# Patient Record
Sex: Male | Born: 1960 | Race: Black or African American | Hispanic: No | Marital: Married | Smoking: Never smoker
Health system: Southern US, Community
[De-identification: ages and names within clinical notes are randomized; demographics above are authoritative.]

## PROBLEM LIST (undated history)

## (undated) DIAGNOSIS — M1990 Unspecified osteoarthritis, unspecified site: Secondary | ICD-10-CM

## (undated) DIAGNOSIS — I10 Essential (primary) hypertension: Secondary | ICD-10-CM

## (undated) DIAGNOSIS — E785 Hyperlipidemia, unspecified: Secondary | ICD-10-CM

## (undated) DIAGNOSIS — R7303 Prediabetes: Secondary | ICD-10-CM

## (undated) DIAGNOSIS — I639 Cerebral infarction, unspecified: Secondary | ICD-10-CM

## (undated) HISTORY — PX: COLONOSCOPY: SHX174

---

## 2007-05-13 ENCOUNTER — Ambulatory Visit: Payer: Self-pay | Admitting: Unknown Physician Specialty

## 2007-05-25 ENCOUNTER — Ambulatory Visit: Payer: Self-pay

## 2012-09-23 ENCOUNTER — Ambulatory Visit: Payer: Self-pay | Admitting: Gastroenterology

## 2012-10-03 LAB — PATHOLOGY REPORT

## 2014-03-26 DIAGNOSIS — E785 Hyperlipidemia, unspecified: Secondary | ICD-10-CM

## 2014-03-26 DIAGNOSIS — I10 Essential (primary) hypertension: Secondary | ICD-10-CM | POA: Diagnosis present

## 2016-06-05 ENCOUNTER — Ambulatory Visit
Admission: RE | Admit: 2016-06-05 | Discharge: 2016-06-05 | Disposition: A | Discharge status: Home / Self Care | Payer: Self-pay | Source: Ambulatory Visit | Attending: Student | Admitting: Student

## 2016-06-05 ENCOUNTER — Other Ambulatory Visit: Payer: Self-pay | Admitting: Student

## 2016-06-05 DIAGNOSIS — N50812 Left testicular pain: Secondary | ICD-10-CM

## 2016-06-05 DIAGNOSIS — N5089 Other specified disorders of the male genital organs: Secondary | ICD-10-CM

## 2016-06-05 DIAGNOSIS — N433 Hydrocele, unspecified: Secondary | ICD-10-CM | POA: Insufficient documentation

## 2021-01-09 ENCOUNTER — Other Ambulatory Visit: Payer: Self-pay

## 2021-01-09 ENCOUNTER — Emergency Department: Payer: 59

## 2021-01-09 ENCOUNTER — Inpatient Hospital Stay
Admission: EM | Admit: 2021-01-09 | Discharge: 2021-01-10 | DRG: 066 | Disposition: A | Discharge status: Home / Self Care | Payer: 59 | Attending: Internal Medicine | Admitting: Internal Medicine

## 2021-01-09 ENCOUNTER — Inpatient Hospital Stay: Payer: 59

## 2021-01-09 ENCOUNTER — Encounter: Payer: Self-pay | Admitting: Internal Medicine

## 2021-01-09 DIAGNOSIS — Z20822 Contact with and (suspected) exposure to covid-19: Secondary | ICD-10-CM | POA: Diagnosis present

## 2021-01-09 DIAGNOSIS — I63541 Cerebral infarction due to unspecified occlusion or stenosis of right cerebellar artery: Principal | ICD-10-CM | POA: Diagnosis present

## 2021-01-09 DIAGNOSIS — R7301 Impaired fasting glucose: Secondary | ICD-10-CM | POA: Diagnosis present

## 2021-01-09 DIAGNOSIS — I639 Cerebral infarction, unspecified: Secondary | ICD-10-CM

## 2021-01-09 DIAGNOSIS — E785 Hyperlipidemia, unspecified: Secondary | ICD-10-CM | POA: Diagnosis present

## 2021-01-09 DIAGNOSIS — I10 Essential (primary) hypertension: Secondary | ICD-10-CM | POA: Diagnosis present

## 2021-01-09 DIAGNOSIS — R29700 NIHSS score 0: Secondary | ICD-10-CM | POA: Diagnosis present

## 2021-01-09 DIAGNOSIS — R42 Dizziness and giddiness: Secondary | ICD-10-CM

## 2021-01-09 HISTORY — DX: Essential (primary) hypertension: I10

## 2021-01-09 HISTORY — DX: Hyperlipidemia, unspecified: E78.5

## 2021-01-09 LAB — CBC
HCT: 46.2 % (ref 39.0–52.0)
Hemoglobin: 15.4 g/dL (ref 13.0–17.0)
MCH: 29.2 pg (ref 26.0–34.0)
MCHC: 33.3 g/dL (ref 30.0–36.0)
MCV: 87.7 fL (ref 80.0–100.0)
Platelets: 202 10*3/uL (ref 150–400)
RBC: 5.27 MIL/uL (ref 4.22–5.81)
RDW: 12.8 % (ref 11.5–15.5)
WBC: 6.5 10*3/uL (ref 4.0–10.5)
nRBC: 0 % (ref 0.0–0.2)

## 2021-01-09 LAB — URINALYSIS, COMPLETE (UACMP) WITH MICROSCOPIC
Bilirubin Urine: NEGATIVE
Glucose, UA: NEGATIVE mg/dL
Hgb urine dipstick: NEGATIVE
Ketones, ur: NEGATIVE mg/dL
Nitrite: NEGATIVE
Protein, ur: NEGATIVE mg/dL
Specific Gravity, Urine: 1.012 (ref 1.005–1.030)
Squamous Epithelial / HPF: NONE SEEN (ref 0–5)
pH: 6 (ref 5.0–8.0)

## 2021-01-09 LAB — BASIC METABOLIC PANEL
Anion gap: 10 (ref 5–15)
BUN: 15 mg/dL (ref 6–20)
CO2: 26 mmol/L (ref 22–32)
Calcium: 9.5 mg/dL (ref 8.9–10.3)
Chloride: 101 mmol/L (ref 98–111)
Creatinine, Ser: 1.05 mg/dL (ref 0.61–1.24)
GFR, Estimated: >60 mL/min (ref >60)
Glucose, Bld: 106 mg/dL — ABNORMAL HIGH (ref 70–99)
Potassium: 4.1 mmol/L (ref 3.5–5.1)
Sodium: 137 mmol/L (ref 135–145)

## 2021-01-09 LAB — HEMOGLOBIN A1C
Hgb A1c MFr Bld: 5.9 % — ABNORMAL HIGH (ref 4.8–5.6)
Mean Plasma Glucose: 122.63 mg/dL

## 2021-01-09 LAB — RESP PANEL BY RT-PCR (FLU A&B, COVID) ARPGX2
Influenza A by PCR: NEGATIVE
Influenza B by PCR: NEGATIVE
SARS Coronavirus 2 by RT PCR: NEGATIVE

## 2021-01-09 LAB — TSH: TSH: 0.256 u[IU]/mL — ABNORMAL LOW (ref 0.350–4.500)

## 2021-01-09 LAB — LDL CHOLESTEROL, DIRECT: Direct LDL: 152.6 mg/dL — ABNORMAL HIGH (ref 0–99)

## 2021-01-09 MED ORDER — ACETAMINOPHEN 650 MG RE SUPP: 650.0000 mg | RECTAL | Status: DC | PRN

## 2021-01-09 MED ORDER — CLOPIDOGREL BISULFATE 75 MG PO TABS
75.0000 mg | ORAL_TABLET | Freq: Every day | ORAL | Status: DC
  Administered 2021-01-09 – 2021-01-10 (×2): 75 mg via ORAL
  Filled 2021-01-09 (×2): qty 1

## 2021-01-09 MED ORDER — GADOBUTROL 1 MMOL/ML IV SOLN
9.0000 mL | Freq: Once | INTRAVENOUS | Status: AC | PRN
  Administered 2021-01-09: 9 mL via INTRAVENOUS
  Filled 2021-01-09: qty 10

## 2021-01-09 MED ORDER — ACETAMINOPHEN 325 MG PO TABS: 650.0000 mg | ORAL_TABLET | ORAL | Status: DC | PRN

## 2021-01-09 MED ORDER — ONDANSETRON 8 MG PO TBDP
8.0000 mg | ORAL_TABLET | ORAL | Status: AC
  Administered 2021-01-09: 8 mg via ORAL
  Filled 2021-01-09: qty 1

## 2021-01-09 MED ORDER — ASPIRIN 81 MG PO CHEW
324.0000 mg | CHEWABLE_TABLET | Freq: Once | ORAL | Status: AC
  Administered 2021-01-09: 324 mg via ORAL
  Filled 2021-01-09: qty 4

## 2021-01-09 MED ORDER — ACETAMINOPHEN 160 MG/5ML PO SOLN
650.0000 mg | ORAL | Status: DC | PRN
  Filled 2021-01-09: qty 20.3

## 2021-01-09 MED ORDER — ENOXAPARIN SODIUM 40 MG/0.4ML IJ SOSY
40.0000 mg | PREFILLED_SYRINGE | INTRAMUSCULAR | Status: DC
  Administered 2021-01-10: 40 mg via SUBCUTANEOUS
  Filled 2021-01-09: qty 0.4

## 2021-01-09 MED ORDER — MECLIZINE HCL 25 MG PO TABS
25.0000 mg | ORAL_TABLET | Freq: Three times a day (TID) | ORAL | Status: DC | PRN
  Filled 2021-01-09: qty 1

## 2021-01-09 MED ORDER — ASPIRIN EC 81 MG PO TBEC
81.0000 mg | DELAYED_RELEASE_TABLET | Freq: Every day | ORAL | Status: DC
  Administered 2021-01-10: 81 mg via ORAL
  Filled 2021-01-09: qty 1

## 2021-01-09 MED ORDER — MECLIZINE HCL 25 MG PO TABS
50.0000 mg | ORAL_TABLET | Freq: Once | ORAL | Status: AC
  Administered 2021-01-09: 50 mg via ORAL
  Filled 2021-01-09: qty 2

## 2021-01-09 MED ORDER — SENNOSIDES-DOCUSATE SODIUM 8.6-50 MG PO TABS: 1.0000 | ORAL_TABLET | Freq: Every evening | ORAL | Status: DC | PRN

## 2021-01-09 MED ORDER — STROKE: EARLY STAGES OF RECOVERY BOOK: Freq: Once | Status: DC

## 2021-01-09 MED ORDER — ATORVASTATIN CALCIUM 20 MG PO TABS
40.0000 mg | ORAL_TABLET | Freq: Every day | ORAL | Status: DC
  Administered 2021-01-09 – 2021-01-10 (×2): 40 mg via ORAL
  Filled 2021-01-09 (×2): qty 2

## 2021-01-09 NOTE — H&P (Signed)
[facility] History and Physical  Nathan Evans NOM:[id number] DOB: 05-08-1961 DOA: 01/09/2021  Referring physician: ED  PCP: Pcp, No   Patient is coming from: Home  Chief Complaint: Dizziness  HPI: Nathan Evans is a 60 y.o. male with past medical history of hypertension and hyperlipidemia presented to hospital with dizziness since 9 AM yesterday.  He stated that he had multiple episodes of dizziness sweating diaphoresis and nausea up to 5 times yesterday.  He was very unsteady on his feet and had to hold on the wall.  This morning also he did have 2 episodes of similar episodes of dizziness and nausea.  Patient states that he was supposed to be on medication for blood pressure but has not been taking it.  Used to be on hydrochlorothiazide in the past.  ED Course: In the ED, patient to have high blood pressure of 193/104.  No focal neurological deficits.  EKG was within normal limits.  MRI of the brain was performed which showed evidence of small acute infarcts in the inferior medial right cerebellum measuring up to 7 mm.  Cerebral white matter chronic ischemic disease.  Angiogram of the head showed no intracranial large vessel occlusion.  Labs were within normal limits.  Patient was then considered for admission to hospital for CVA.  Review of Systems:  All systems were reviewed and were negative unless otherwise mentioned in the HPI  Past Medical History:  Diagnosis Date  . Hyperlipidemia   . Hypertension    History reviewed. No pertinent surgical history.  Social History:  reports that he has never smoked. He has never used smokeless tobacco. No history on file for alcohol use and drug use.  No Known Allergies  History reviewed. No pertinent family history.   Prior to Admission medications   Medication Sig Start Date End Date Taking? Authorizing Provider  aspirin 81 MG EC tablet Take 81 mg by mouth daily.   Yes [provider]  fexofenadine  (ALLEGRA) 180 MG tablet Take 180 mg by mouth daily.   Yes [provider]  hydrochlorothiazide (HYDRODIURIL) 25 MG tablet Take 1 tablet by mouth daily. Patient not taking: Reported on 01/09/2021 11/06/16   [provider]  simvastatin (ZOCOR) 20 MG tablet Take 20 mg by mouth daily. Patient not taking: Reported on 01/09/2021 11/06/16   [provider]    Physical Exam: Vitals:   01/09/21 1027 01/09/21 1446  BP: (S) (!) 193/104 (!) 191/112  Pulse: 79 72  Resp: 17 18  Temp: 98.2 F (36.8 C)   TempSrc: Oral   SpO2: 100% 98%   Wt Readings from Last 3 Encounters:  No data found for Wt   There is no height or weight on file to calculate BMI.  General:  Average built, not in obvious distress HENT: Normocephalic, pupils equally reacting to light and accommodation.  No scleral pallor or icterus noted. Oral mucosa is moist.  Chest:  Clear breath sounds.  Diminished breath sounds bilaterally. No crackles or wheezes.  CVS: S1 &S2 heard. No murmur.  Regular rate and rhythm. Abdomen: Soft, nontender, nondistended.  Bowel sounds are heard.  Liver is not palpable, no abdominal mass palpated Extremities: No cyanosis, clubbing or edema.  Peripheral pulses are palpable. Psych: Alert, awake and oriented, normal mood CNS:  No cranial nerve deficits.  Power equal in all extremities.   No cerebellar signs.   Skin: Warm and dry.  No rashes noted.  Labs on Admission:   CBC: Recent  Labs  Lab 01/09/21 1027  WBC 6.5  HGB 15.4  HCT 46.2  MCV 87.7  PLT 202    Basic Metabolic Panel: Recent Labs  Lab 01/09/21 1027  NA 137  K 4.1  CL 101  CO2 26  GLUCOSE 106*  BUN 15  CREATININE 1.05  CALCIUM 9.5    Liver Function Tests: No results for input(s): AST, ALT, ALKPHOS, BILITOT, PROT, ALBUMIN in the last 168 hours. No results for input(s): LIPASE, AMYLASE in the last 168 hours. No results for input(s): AMMONIA in the last 168 hours.  Cardiac Enzymes: No results for  input(s): CKTOTAL, CKMB, CKMBINDEX, TROPONINI in the last 168 hours.  BNP (last 3 results) No results for input(s): BNP in the last 8760 hours.  ProBNP (last 3 results) No results for input(s): PROBNP in the last 8760 hours.  CBG: No results for input(s): GLUCAP in the last 168 hours.  Lipase  No results found for: LIPASE   Urinalysis    Component Value Date/Time   COLORURINE YELLOW (A) 01/09/2021 1300   APPEARANCEUR HAZY (A) 01/09/2021 1300   LABSPEC 1.012 01/09/2021 1300   PHURINE 6.0 01/09/2021 1300   GLUCOSEU NEGATIVE 01/09/2021 1300   HGBUR NEGATIVE 01/09/2021 1300   BILIRUBINUR NEGATIVE 01/09/2021 1300   KETONESUR NEGATIVE 01/09/2021 1300   PROTEINUR NEGATIVE 01/09/2021 1300   NITRITE NEGATIVE 01/09/2021 1300   LEUKOCYTESUR TRACE (A) 01/09/2021 1300     Drugs of Abuse  No results found for: LABOPIA, COCAINSCRNUR, LABBENZ, AMPHETMU, THCU, LABBARB    Radiological Exams on Admission: MR ANGIO HEAD WO CONTRAST  Result Date: 01/09/2021 CLINICAL DATA:  Dizziness, nonspecific. Additional history provided: Patient reports nausea/vomiting with dizziness, diaphoresis since last night. EXAM: MRI HEAD WITHOUT CONTRAST MRA HEAD WITHOUT CONTRAST TECHNIQUE: Multiplanar, multi-echo pulse sequences of the brain and surrounding structures were acquired without intravenous contrast. Angiographic images of the Circle of Willis were acquired using MRA technique without intravenous contrast. COMPARISON: No pertinent prior exam. COMPARISON:  Report from brain MRI 01/21/2000 (images unavailable). FINDINGS: MRI HEAD FINDINGS Brain: Cerebral volume is normal for age. There are small acute infarcts within the inferomedial right cerebellum measuring up to 7 mm (PICA territory) (series 5, image 11) (series 5, images 9-10). Mild multifocal T2/FLAIR hyperintensity within the cerebral white matter, nonspecific but compatible with chronic small vessel ischemic disease. No evidence of intracranial mass. No  chronic intracranial blood products. No extra-axial fluid collection. No midline shift. Vascular: Expected proximal arterial flow voids. Skull and upper cervical spine: No focal marrow lesion. Sinuses/Orbits: Visualized orbits show no acute finding. Mild mucosal thickening within the right frontal sinus. Moderate mucosal thickening within the left frontal sinus. Moderate bilateral ethmoid sinus mucosal thickening. Tiny left maxillary sinus mucous retention cyst. MRA HEAD FINDINGS Anterior circulation: The intracranial internal carotid arteries are patent. The M1 middle cerebral arteries are patent. No M2 proximal branch occlusion or high-grade proximal stenosis is identified. The A1 right anterior cerebral artery is developmentally absent or markedly hypoplastic. The anterior cerebral arteries are otherwise patent. 1-2 mm posteriorly projecting vascular protrusion arising from the paraclinoid right ICA, which may reflect an aneurysm or infundibulum (series 1, image 109). Posterior circulation: The intracranial vertebral arteries are patent. The left vertebral artery is slightly dominant. The basilar artery is patent. Flow related signal is present within the proximal right PICA. However, there is an apparent severe stenosis within the proximal right PICA (series 1041, image 15). The posterior cerebral arteries are patent. Posterior communicating arteries are hypoplastic  or absent bilaterally. Anatomic variants: As described IMPRESSION: MRI brain: 1. Small acute infarcts within the inferomedial right cerebellum, measuring up to 7 mm (PICA territory). 2. Mild cerebral white matter chronic small vessel ischemic disease. 3. Paranasal sinus disease, as described. MRA head: 1. No intracranial large vessel occlusion. 2. 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 3. 1-2 mm posteriorly projecting vascular protrusion arising from the paraclinoid right ICA, which may reflect an aneurysm or infundibulum.  Electronically Signed   By: Jackey LogeKyle  Golden DO   On: 01/09/2021 13:58   MR BRAIN WO CONTRAST  Result Date: 01/09/2021 CLINICAL DATA:  Dizziness, nonspecific. Additional history provided: Patient reports nausea/vomiting with dizziness, diaphoresis since last night. EXAM: MRI HEAD WITHOUT CONTRAST MRA HEAD WITHOUT CONTRAST TECHNIQUE: Multiplanar, multi-echo pulse sequences of the brain and surrounding structures were acquired without intravenous contrast. Angiographic images of the Circle of Willis were acquired using MRA technique without intravenous contrast. COMPARISON: No pertinent prior exam. COMPARISON:  Report from brain MRI 01/21/2000 (images unavailable). FINDINGS: MRI HEAD FINDINGS Brain: Cerebral volume is normal for age. There are small acute infarcts within the inferomedial right cerebellum measuring up to 7 mm (PICA territory) (series 5, image 11) (series 5, images 9-10). Mild multifocal T2/FLAIR hyperintensity within the cerebral white matter, nonspecific but compatible with chronic small vessel ischemic disease. No evidence of intracranial mass. No chronic intracranial blood products. No extra-axial fluid collection. No midline shift. Vascular: Expected proximal arterial flow voids. Skull and upper cervical spine: No focal marrow lesion. Sinuses/Orbits: Visualized orbits show no acute finding. Mild mucosal thickening within the right frontal sinus. Moderate mucosal thickening within the left frontal sinus. Moderate bilateral ethmoid sinus mucosal thickening. Tiny left maxillary sinus mucous retention cyst. MRA HEAD FINDINGS Anterior circulation: The intracranial internal carotid arteries are patent. The M1 middle cerebral arteries are patent. No M2 proximal branch occlusion or high-grade proximal stenosis is identified. The A1 right anterior cerebral artery is developmentally absent or markedly hypoplastic. The anterior cerebral arteries are otherwise patent. 1-2 mm posteriorly projecting vascular  protrusion arising from the paraclinoid right ICA, which may reflect an aneurysm or infundibulum (series 1, image 109). Posterior circulation: The intracranial vertebral arteries are patent. The left vertebral artery is slightly dominant. The basilar artery is patent. Flow related signal is present within the proximal right PICA. However, there is an apparent severe stenosis within the proximal right PICA (series 1041, image 15). The posterior cerebral arteries are patent. Posterior communicating arteries are hypoplastic or absent bilaterally. Anatomic variants: As described IMPRESSION: MRI brain: 1. Small acute infarcts within the inferomedial right cerebellum, measuring up to 7 mm (PICA territory). 2. Mild cerebral white matter chronic small vessel ischemic disease. 3. Paranasal sinus disease, as described. MRA head: 1. No intracranial large vessel occlusion. 2. 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 3. 1-2 mm posteriorly projecting vascular protrusion arising from the paraclinoid right ICA, which may reflect an aneurysm or infundibulum. Electronically Signed   By: Jackey LogeKyle  Golden DO   On: 01/09/2021 13:58    EKG: Personally reviewed by me which shows normal sinus rhythm  Assessment/Plan Principal Problem:   CVA (cerebral vascular accident) [facility](HCC) Active Problems:   Dizziness   Essential hypertension  Acute CVA likely secondary to uncontrolled hypertension.  MRI of the brain shows infarcts of the cerebellum no acute intracranial large vessel occlusion.  We will continue with stroke work-up including PT OT speech therapy.  Check hemoglobin A1c lipid profile.  Check 2D echocardiogram.  Check MRA of the neck.  Neurology was notified from the ED in the ED physician reported to me that neuro to follow the patient in a.m and add Plavix to aspirin..  We will continue with aspirin and Plavix.  Meclizine as needed. Add lipitor 40mg  for now.  Uncontrolled hypertension.  Present on admission.   Patient was on hydrochlorothiazide at home.  Will allow permissive hypertension for now.  Will resume antihypertensives on discharge.   DVT Prophylaxis: Lovenox subcu  Consultant: Neurology  Code Status: Full code  Microbiology none  Antibiotics: None  Family Communication:  Patients' condition and plan of care including tests being ordered have been discussed with the patient who indicate understanding and agree with the plan.  Status is: Inpatient  Remains inpatient appropriate because:Ongoing diagnostic testing needed not appropriate for outpatient work up, IV treatments appropriate due to intensity of illness or inability to take PO, Inpatient level of care appropriate due to severity of illness and Stroke work-up   Dispo: The patient is from: Home              Anticipated d/c is to: Home                Severity of Illness: The appropriate patient status for this patient is INPATIENT. Inpatient status is judged to be reasonable and necessary in order to provide the required intensity of service to ensure the patient's safety. The patient's presenting symptoms, physical exam findings, and initial radiographic and laboratory data in the context of their chronic comorbidities is felt to place them at high risk for further clinical deterioration. Furthermore, it is not anticipated that the patient will be medically stable for discharge from the hospital within 2 midnights of admission. I certify that at the point of admission it is my clinical judgment that the patient will require inpatient hospital care spanning beyond 2 midnights from the point of admission due to high intensity of service, high risk for further deterioration and high frequency of surveillance required.   Signed, , MD [facility] 01/09/2021

## 2021-01-09 NOTE — Progress Notes (Signed)
SLP Cancellation Note  Patient Details Name: Nathan Evans MRN: [id number] DOB: 03/14/1961   Cancelled treatment:       Reason Eval/Treat Not Completed: SLP screened, no needs identified, will sign off (chart reviewed; consulted NSG; met w/ pt and Wife in room).  Pt denied any difficulty swallowing and is currently on a regular diet; tolerates swallowing pills/water earlier per NSG. Pt A/O x4; conversed at conversational level w/out deficits noted. Speech clear. Pt and Wife denied any speech-language deficits, or swallowing deficits. Pt had a good sense of humor.  No further skilled ST services indicated as pt appears at his baseline. Pt agreed. NSG to reconsult if any change in status while admitted. Assisted Wife/pt in ordering his Dinner meal.      Katherine Watson, MS, CCC-SLP Speech Language Pathologist [facility] [phone] Watson,Katherine 01/09/2021, 6:15 PM   

## 2021-01-09 NOTE — ED Triage Notes (Signed)
Pt comes into the ED via EMS from home with N/V with dizziness , diaphoresis since last night   195/128 [id number] 98%RA

## 2021-01-09 NOTE — ED Notes (Signed)
Pt taken for MRI.

## 2021-01-09 NOTE — ED Provider Notes (Signed)
[facility] Emergency Department Provider Note  ____________________________________________  Time seen: Approximately 2:21 PM  I have reviewed the triage vital signs and the nursing notes.   HISTORY  Chief Complaint Dizziness    HPI Nathan FoersterJeffrey L Evans is a 60 y.o. male with a past history of hyperlipidemia and hypertension, not currently taking any medications, who complains of a feeling of disequilibrium since yesterday morning about 9:00 AM.  Its waxing and waning with about 5 episodes of worsening severity yesterday and 2 or 3 episodes today.  Associated with nausea and vomiting, diaphoresis, unsteadiness on his feet.  Denies falls or head injury.  No chest pain or shortness of breath.  No motor weakness or paresthesia.  No thunderclap headache   Currently feels that symptoms are improved but still has about 2/10 feeling of unsteadiness.   Past medical history Hypertension Hyperlipidemia   There are no problems to display for this patient.       Prior to Admission medications   Not on File  None   Allergies Patient has no allergy information on record.   No family history on file.  Social History    Review of Systems  Constitutional:   No fever or chills.  ENT:   No sore throat. No rhinorrhea. Cardiovascular:   No chest pain or syncope. Respiratory:   No dyspnea or cough. Gastrointestinal:   Negative for abdominal pain, vomiting and diarrhea.  Musculoskeletal:   Negative for focal pain or swelling All other systems reviewed and are negative except as documented above in ROS and HPI.  ____________________________________________   PHYSICAL EXAM:  VITAL SIGNS: ED Triage Vitals [01/09/21 1027]  Enc Vitals Group     BP (S) (!) 193/104     Pulse Rate 79     Resp 17     Temp 98.2 F (36.8 C)     Temp Source Oral     SpO2 100 %     Weight      Height      Head Circumference      Peak Flow      Pain Score      Pain Loc       Pain Edu?      Excl. in GC?     Vital signs reviewed, nursing assessments reviewed.   Constitutional:   Alert and oriented. Non-toxic appearance. Eyes:   Conjunctivae are normal. EOMI. PERRL.  No nystagmus ENT      Head:   Normocephalic and atraumatic.  Cerumen impaction of right ear      Nose:   Wearing a mask.      Mouth/Throat:   Wearing a mask.      Neck:   No meningismus. Full ROM. Hematological/Lymphatic/Immunilogical:   No cervical lymphadenopathy. Cardiovascular:   RRR. Symmetric bilateral radial and DP pulses.  No murmurs. Cap refill less than 2 seconds. Respiratory:   Normal respiratory effort without tachypnea/retractions. Breath sounds are clear and equal bilaterally. No wheezes/rales/rhonchi. Gastrointestinal:   Soft and nontender. Non distended. There is no CVA tenderness.  No rebound, rigidity, or guarding. Genitourinary:   deferred Musculoskeletal:   Normal range of motion in all extremities. No joint effusions.  No lower extremity tenderness.  No edema. Neurologic:   Normal speech and language.  Finger-nose-finger normal No pronator drift Normal gait Motor grossly intact. No acute focal neurologic deficits are appreciated.  Stroke scale 0 Skin:    Skin is warm, dry and intact. No rash noted.  No petechiae, purpura, or bullae.  ____________________________________________    LABS (pertinent positives/negatives) (all labs ordered are listed, but only abnormal results are displayed) Labs Reviewed  BASIC METABOLIC PANEL - Abnormal; Notable for the following components:      Result Value   Glucose, Bld 106 (*)    All other components within normal limits  URINALYSIS, COMPLETE (UACMP) WITH MICROSCOPIC - Abnormal; Notable for the following components:   Color, Urine YELLOW (*)    APPearance HAZY (*)    Leukocytes,Ua TRACE (*)    Bacteria, UA MANY (*)    All other components within normal limits  TSH - Abnormal; Notable for the following components:   TSH  0.256 (*)    All other components within normal limits  RESP PANEL BY RT-PCR (FLU A&B, COVID) ARPGX2  CBC  CBG MONITORING, ED   ____________________________________________   EKG  Interpreted by me  Date: 01/09/2021  Rate: 71  Rhythm: normal sinus rhythm  QRS Axis: normal  Intervals: normal  ST/T Wave abnormalities: normal  Conduction Disutrbances: none  Narrative Interpretation: unremarkable      ____________________________________________    RADIOLOGY  MR ANGIO HEAD WO CONTRAST  Result Date: 01/09/2021 CLINICAL DATA:  Dizziness, nonspecific. Additional history provided: Patient reports nausea/vomiting with dizziness, diaphoresis since last night. EXAM: MRI HEAD WITHOUT CONTRAST MRA HEAD WITHOUT CONTRAST TECHNIQUE: Multiplanar, multi-echo pulse sequences of the brain and surrounding structures were acquired without intravenous contrast. Angiographic images of the Circle of Willis were acquired using MRA technique without intravenous contrast. COMPARISON: No pertinent prior exam. COMPARISON:  Report from brain MRI 01/21/2000 (images unavailable). FINDINGS: MRI HEAD FINDINGS Brain: Cerebral volume is normal for age. There are small acute infarcts within the inferomedial right cerebellum measuring up to 7 mm (PICA territory) (series 5, image 11) (series 5, images 9-10). Mild multifocal T2/FLAIR hyperintensity within the cerebral white matter, nonspecific but compatible with chronic small vessel ischemic disease. No evidence of intracranial mass. No chronic intracranial blood products. No extra-axial fluid collection. No midline shift. Vascular: Expected proximal arterial flow voids. Skull and upper cervical spine: No focal marrow lesion. Sinuses/Orbits: Visualized orbits show no acute finding. Mild mucosal thickening within the right frontal sinus. Moderate mucosal thickening within the left frontal sinus. Moderate bilateral ethmoid sinus mucosal thickening. Tiny left maxillary sinus  mucous retention cyst. MRA HEAD FINDINGS Anterior circulation: The intracranial internal carotid arteries are patent. The M1 middle cerebral arteries are patent. No M2 proximal branch occlusion or high-grade proximal stenosis is identified. The A1 right anterior cerebral artery is developmentally absent or markedly hypoplastic. The anterior cerebral arteries are otherwise patent. 1-2 mm posteriorly projecting vascular protrusion arising from the paraclinoid right ICA, which may reflect an aneurysm or infundibulum (series 1, image 109). Posterior circulation: The intracranial vertebral arteries are patent. The left vertebral artery is slightly dominant. The basilar artery is patent. Flow related signal is present within the proximal right PICA. However, there is an apparent severe stenosis within the proximal right PICA (series 1041, image 15). The posterior cerebral arteries are patent. Posterior communicating arteries are hypoplastic or absent bilaterally. Anatomic variants: As described IMPRESSION: MRI brain: 1. Small acute infarcts within the inferomedial right cerebellum, measuring up to 7 mm (PICA territory). 2. Mild cerebral white matter chronic small vessel ischemic disease. 3. Paranasal sinus disease, as described. MRA head: 1. No intracranial large vessel occlusion. 2. 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 3. 1-2 mm posteriorly projecting vascular protrusion arising from the paraclinoid right  ICA, which may reflect an aneurysm or infundibulum. Electronically Signed   By: Jackey Loge DO   On: 01/09/2021 13:58   MR BRAIN WO CONTRAST  Result Date: 01/09/2021 CLINICAL DATA:  Dizziness, nonspecific. Additional history provided: Patient reports nausea/vomiting with dizziness, diaphoresis since last night. EXAM: MRI HEAD WITHOUT CONTRAST MRA HEAD WITHOUT CONTRAST TECHNIQUE: Multiplanar, multi-echo pulse sequences of the brain and surrounding structures were acquired without intravenous  contrast. Angiographic images of the Circle of Willis were acquired using MRA technique without intravenous contrast. COMPARISON: No pertinent prior exam. COMPARISON:  Report from brain MRI 01/21/2000 (images unavailable). FINDINGS: MRI HEAD FINDINGS Brain: Cerebral volume is normal for age. There are small acute infarcts within the inferomedial right cerebellum measuring up to 7 mm (PICA territory) (series 5, image 11) (series 5, images 9-10). Mild multifocal T2/FLAIR hyperintensity within the cerebral white matter, nonspecific but compatible with chronic small vessel ischemic disease. No evidence of intracranial mass. No chronic intracranial blood products. No extra-axial fluid collection. No midline shift. Vascular: Expected proximal arterial flow voids. Skull and upper cervical spine: No focal marrow lesion. Sinuses/Orbits: Visualized orbits show no acute finding. Mild mucosal thickening within the right frontal sinus. Moderate mucosal thickening within the left frontal sinus. Moderate bilateral ethmoid sinus mucosal thickening. Tiny left maxillary sinus mucous retention cyst. MRA HEAD FINDINGS Anterior circulation: The intracranial internal carotid arteries are patent. The M1 middle cerebral arteries are patent. No M2 proximal branch occlusion or high-grade proximal stenosis is identified. The A1 right anterior cerebral artery is developmentally absent or markedly hypoplastic. The anterior cerebral arteries are otherwise patent. 1-2 mm posteriorly projecting vascular protrusion arising from the paraclinoid right ICA, which may reflect an aneurysm or infundibulum (series 1, image 109). Posterior circulation: The intracranial vertebral arteries are patent. The left vertebral artery is slightly dominant. The basilar artery is patent. Flow related signal is present within the proximal right PICA. However, there is an apparent severe stenosis within the proximal right PICA (series 1041, image 15). The posterior  cerebral arteries are patent. Posterior communicating arteries are hypoplastic or absent bilaterally. Anatomic variants: As described IMPRESSION: MRI brain: 1. Small acute infarcts within the inferomedial right cerebellum, measuring up to 7 mm (PICA territory). 2. Mild cerebral white matter chronic small vessel ischemic disease. 3. Paranasal sinus disease, as described. MRA head: 1. No intracranial large vessel occlusion. 2. 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 3. 1-2 mm posteriorly projecting vascular protrusion arising from the paraclinoid right ICA, which may reflect an aneurysm or infundibulum. Electronically Signed   By: Jackey Loge DO   On: 01/09/2021 13:58    ____________________________________________   PROCEDURES Procedures  ____________________________________________  DIFFERENTIAL DIAGNOSIS   Stroke, vertigo, paroxysmal dysrhythmia, dehydration, symptomatic hypertension  CLINICAL IMPRESSION / ASSESSMENT AND PLAN / ED COURSE  Medications ordered in the ED: Medications  aspirin chewable tablet 324 mg (has no administration in time range)  meclizine (ANTIVERT) tablet 50 mg (50 mg Oral Given 01/09/21 1200)  ondansetron (ZOFRAN-ODT) disintegrating tablet 8 mg (8 mg Oral Given 01/09/21 1200)    Pertinent labs & imaging results that were available during my care of the patient were reviewed by me and considered in my medical decision making (see chart for details).  Nathan Evans was evaluated in Emergency Department on 01/09/2021 for the symptoms described in the history of present illness. He was evaluated in the context of the global COVID-19 pandemic, which necessitated consideration that the patient might be at risk for  infection with the SARS-CoV-2 virus that causes COVID-19. Institutional protocols and algorithms that pertain to the evaluation of patients at risk for COVID-19 are in a state of rapid change based on information released by regulatory  bodies including the CDC and federal and state organizations. These policies and algorithms were followed during the patient's care in the ED.   Patient presents with multiple episodes of dizziness/vertiginous symptoms over the past 30 hours.  Vital signs show uncontrolled hypertension in the setting of medication noncompliance.  Labs unremarkable.  MRI shows multiple small cerebellar strokes.  EKG is normal, no A. Fib.  Will give 324 mg of aspirin after performing swallow screen.  We will need to hospitalize for further stroke work-up.      ____________________________________________   FINAL CLINICAL IMPRESSION(S) / ED DIAGNOSES    Final diagnoses:  Ischemic stroke [facility])  Uncontrolled hypertension     ED Discharge Orders    None      Portions of this note were generated with dragon dictation software. Dictation errors may occur despite best attempts at proofreading.   Sharman Cheek, MD 01/09/21 1425

## 2021-01-10 ENCOUNTER — Inpatient Hospital Stay
Admit: 2021-01-10 | Discharge: 2021-01-10 | Disposition: A | Discharge status: Home / Self Care | Payer: 59 | Attending: Internal Medicine | Admitting: Internal Medicine

## 2021-01-10 DIAGNOSIS — R7301 Impaired fasting glucose: Secondary | ICD-10-CM

## 2021-01-10 DIAGNOSIS — E785 Hyperlipidemia, unspecified: Secondary | ICD-10-CM

## 2021-01-10 DIAGNOSIS — R42 Dizziness and giddiness: Secondary | ICD-10-CM

## 2021-01-10 DIAGNOSIS — I639 Cerebral infarction, unspecified: Secondary | ICD-10-CM

## 2021-01-10 LAB — BASIC METABOLIC PANEL
Anion gap: 9 (ref 5–15)
BUN: 18 mg/dL (ref 6–20)
CO2: 23 mmol/L (ref 22–32)
Calcium: 8.9 mg/dL (ref 8.9–10.3)
Chloride: 105 mmol/L (ref 98–111)
Creatinine, Ser: 1.25 mg/dL — ABNORMAL HIGH (ref 0.61–1.24)
GFR, Estimated: >60 mL/min (ref >60)
Glucose, Bld: 105 mg/dL — ABNORMAL HIGH (ref 70–99)
Potassium: 3.6 mmol/L (ref 3.5–5.1)
Sodium: 137 mmol/L (ref 135–145)

## 2021-01-10 LAB — CBC
HCT: 44.2 % (ref 39.0–52.0)
Hemoglobin: 14.7 g/dL (ref 13.0–17.0)
MCH: 29.5 pg (ref 26.0–34.0)
MCHC: 33.3 g/dL (ref 30.0–36.0)
MCV: 88.6 fL (ref 80.0–100.0)
Platelets: 195 10*3/uL (ref 150–400)
RBC: 4.99 MIL/uL (ref 4.22–5.81)
RDW: 12.9 % (ref 11.5–15.5)
WBC: 6.8 10*3/uL (ref 4.0–10.5)
nRBC: 0 % (ref 0.0–0.2)

## 2021-01-10 LAB — ECHOCARDIOGRAM COMPLETE BUBBLE STUDY
AR max vel: 2.02 cm2
AV Area VTI: 1.91 cm2
AV Area mean vel: 2.07 cm2
AV Mean grad: 4 mmHg
AV Peak grad: 9.6 mmHg
Ao pk vel: 1.55 m/s
Area-P 1/2: 4.39 cm2
MV VTI: 2.53 cm2
S' Lateral: 1.8 cm

## 2021-01-10 LAB — LIPID PANEL
Cholesterol: 188 mg/dL (ref 0–200)
HDL: 40 mg/dL — ABNORMAL LOW (ref >40)
LDL Cholesterol: 130 mg/dL — ABNORMAL HIGH (ref 0–99)
Total CHOL/HDL Ratio: 4.7 RATIO
Triglycerides: 92 mg/dL (ref <150)
VLDL: 18 mg/dL (ref 0–40)

## 2021-01-10 LAB — HIV ANTIBODY (ROUTINE TESTING W REFLEX): HIV Screen 4th Generation wRfx: NONREACTIVE

## 2021-01-10 LAB — MAGNESIUM: Magnesium: 2.2 mg/dL (ref 1.7–2.4)

## 2021-01-10 LAB — PHOSPHORUS: Phosphorus: 3.8 mg/dL (ref 2.5–4.6)

## 2021-01-10 MED ORDER — ATORVASTATIN CALCIUM 80 MG PO TABS: 80.0000 mg | ORAL_TABLET | Freq: Every day | ORAL | Status: DC

## 2021-01-10 MED ORDER — CLOPIDOGREL BISULFATE 75 MG PO TABS: 75.0000 mg | ORAL_TABLET | Freq: Every day | ORAL | 1 refills | Status: DC

## 2021-01-10 MED ORDER — ATORVASTATIN CALCIUM 80 MG PO TABS: 80.0000 mg | ORAL_TABLET | Freq: Every day | ORAL | 1 refills | Status: DC

## 2021-01-10 MED ORDER — ASPIRIN 81 MG PO TBEC: 81.0000 mg | DELAYED_RELEASE_TABLET | Freq: Every day | ORAL | 0 refills | Status: DC

## 2021-01-10 NOTE — Evaluation (Signed)
Occupational Therapy Evaluation Patient Details Name: Nathan Evans MRN: [id number] DOB: 03/13/1961 Today's Date: 01/10/2021    History of Present Illness Pt is a 60 y/o M with hyperlipidemia and hypertension, not currently taking any medications, who complains of a feeling of disequilibrium since yesterday morning about 9:00 AM. Presented to ED w/ c/o N/V and dizziness. On MRI, pt found to have Small acute infarcts within the inferomedial right cerebellum.   Clinical Impression   Pt seen for OT evaluation this date in setting of acute hospitalization d/t CVA. Pt presents this date reporting most symptoms have resolved. Pt reports being INDEP at baseline including working in [employer] and driving. Pt presents this date with very minimal focal deficits of decreased L hand coordination noted on rapid alternating movements test, and very minimal LOB in L LE single leg stance. Pt also endorses some minimal numbness to medial nerve side of L hand (of note: pt is left handed). Pt's handwriting assessed and is legible and pt reports it appears to be his normal. Vision is tested extensively and [facility] for OT assessment purposes. Pt INDEP with fxl mobility and demons GOOD static/dyanmic standing balance with use of B LEs to establish a BOS and no AD. Pt appears to be close to his functional baseline with reports that his fxl mobility is much improved since admission. Pt could benefit from OPOT to improve [facility] of L hand as he is L hand dominant. Will continue to follow acutely.     Follow Up Recommendations  Outpatient OT    Equipment Recommendations  None recommended by OT    Recommendations for Other Services       Precautions / Restrictions Restrictions Weight Bearing Restrictions: No      Mobility Bed Mobility Overal bed mobility: Independent                  Transfers Overall transfer level: Independent                    Balance Overall balance assessment: Mild  deficits observed, not formally tested                                         ADL either performed or assessed with clinical judgement   ADL Overall ADL's : Independent                                             Vision   Vision Assessment?: Yes Eye Alignment: Within Functional Limits Ocular Range of Motion: Within Functional Limits Alignment/Gaze Preference: Within Defined Limits Tracking/Visual Pursuits: Able to track stimulus in all quads without difficulty Saccades: Within functional limits Convergence: Within functional limits     Perception     Praxis      Pertinent Vitals/Pain Pain Assessment: No/denies pain     Hand Dominance Left   Extremity/Trunk Assessment Upper Extremity Assessment Upper Extremity Assessment: RUE deficits/detail;LUE deficits/detail RUE Deficits / Details: ROM, sensation, coordination WFL. MMT 5/5 LUE Deficits / Details: very slight decrease in speed on rapid alternating movement test, very minimal c/o numbness on medial nerve side of L hand. MMT still grossly 5/5. Able to functionally utilize L hand as he is L handed: writes name clearly and reports his handwriting is at  baseline. LUE Coordination: decreased fine motor   Lower Extremity Assessment Lower Extremity Assessment: Defer to PT evaluation;LLE deficits/detail;RLE deficits/detail RLE Deficits / Details: WFL, MMT 5/5 LLE Deficits / Details: very minimal strength deficit noted on single leg stance, pt very minimally loses balance, but is able to right himself.       Communication Communication Communication: No difficulties   Cognition Arousal/Alertness: Awake/alert Behavior During Therapy: WFL for tasks assessed/performed Overall Cognitive Status: Within Functional Limits for tasks assessed                                     General Comments  pt with perfect static standing balance in R LE single leg stance, very minimal LOB in  L LE single leg stance. G dynamic sitting and standing balance with challenge when using B LEs for BOS. No AD required.    Exercises Other Exercises Other Exercises: OT facilitates ed re: role of OT, stroke s/s recognition, FMC exercises. Pt with good understanding. 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        OT Problem List: Decreased coordination      OT Treatment/Interventions: Therapeutic activities;Therapeutic exercise;Neuromuscular education    OT Goals(Current goals can be found in the care plan section) Acute Rehab OT Goals Patient Stated Goal: to be able to resume working, driving and all activity OT Goal Formulation: With patient Time For Goal Achievement: 01/24/21 Potential to Achieve Goals: Good ADL Goals Additional ADL Goal #1: Pt will perform a fine motor coordination manipulation task with 90% success aeb picking up and translating small items from fingertips to palm with 1 or less items dropped Additional ADL Goal #2: Pt will demonstrate understanding of performance of all [facility] exercises/activities listed on handout with no verbal cues.  OT Frequency:     Barriers to D/C:            Co-evaluation              AM-PAC OT "6 Clicks" Daily Activity     Outcome Measure Help from another person eating meals?: None Help from another person taking care of personal grooming?:  None Help from another person toileting, which includes using toliet, bedpan, or urinal?: None Help from another person bathing (including washing, rinsing, drying)?: None Help from another person to put on and taking off regular upper body clothing?: None Help from another person to put on and taking off regular lower body clothing?: None 6 Click Score: 24   End of Session Nurse Communication: Mobility status  Activity Tolerance: Patient tolerated treatment well Patient left: Other (comment) (standing, but gets back to bed as Echo presents at end of session for imaging)  OT Visit Diagnosis: Other (comment) (R27.8 other lack of coordination)  Time: 9924-2683 OT Time Calculation (min): 26 min Charges:  OT General Charges $OT Visit: 1 Visit OT Evaluation $OT Eval Low Complexity: 1 Low OT Treatments $Neuromuscular Re-education: 8-22 mins  Rejeana Brock, MS, OTR/L ascom [phone] 01/10/21, 10:23 AM

## 2021-01-10 NOTE — Discharge Summary (Signed)
[facility] [place] [facility]   PATIENT NAME: Nathan Evans    MR#:  [id number]  DATE OF BIRTH:  [birth year]  DATE OF ADMISSION:  01/09/2021 ADMITTING PHYSICIAN: Joycelyn Das, MD  DATE OF DISCHARGE: 01/10/2021  PRIMARY CARE PHYSICIAN: Pcp, No    ADMISSION DIAGNOSIS:  CVA (cerebral vascular accident) (HCC) [I63.9] Uncontrolled hypertension [I10] Ischemic stroke (HCC) [I63.9]  DISCHARGE DIAGNOSIS:  Principal Problem:   CVA (cerebral vascular accident) (HCC) Active Problems:   Dizziness   Essential hypertension   SECONDARY DIAGNOSIS:   Past Medical History:  Diagnosis Date  . Hyperlipidemia   . Hypertension     HOSPITAL COURSE:   1.  Acute right cerebellar infarcts.  Patient presented with nausea vomiting and unsteady gait.  Patient feeling better and nausea vomiting has resolved.  Feels more steady with moving around.  Patient was started on aspirin and Plavix and Lipitor.  Patient will be on aspirin for for 30 days then this medication can be stopped.  Plavix and Lipitor alone after 30 days.  LDL 130.  Can consider event monitor as outpatient.  [facility] follow-up as outpatient.  Outpatient physical therapy. 2.  Essential hypertension allow permissive hypertension currently.  Patient will get a blood pressure cuff and if blood pressure starts really rising can restart hydrochlorothiazide. 3.  Hyperlipidemia unspecified.  LDL 130.  Lipitor prescribed. 4.  Impaired fasting glucose.  Hemoglobin A1c 5.9. 5.  I did give the patient a note for work for at least for 1 week.  If symptoms persist will have to be out longer than that.   DISCHARGE CONDITIONS:   Satisfactory  CONSULTS OBTAINED:  Treatment Team:  Jefferson Fuel, MD  DRUG ALLERGIES:  No Known Allergies  DISCHARGE MEDICATIONS:   Allergies as of 01/10/2021   No Known Allergies     Medication List    STOP taking these medications   hydrochlorothiazide  25 MG tablet Commonly known as: HYDRODIURIL   simvastatin 20 MG tablet Commonly known as: ZOCOR     TAKE these medications   aspirin 81 MG EC tablet Take 1 tablet (81 mg total) by mouth daily.   atorvastatin 80 MG tablet Commonly known as: LIPITOR Take 1 tablet (80 mg total) by mouth daily. Start taking on: Jan 11, 2021   clopidogrel 75 MG tablet Commonly known as: PLAVIX Take 1 tablet (75 mg total) by mouth daily. Start taking on: Jan 11, 2021   fexofenadine 180 MG tablet Commonly known as: ALLEGRA Take 180 mg by mouth daily.        DISCHARGE INSTRUCTIONS:   Patient would like to schedule follow-up at [facility]. Referral to [facility] neurology  If you experience worsening of your admission symptoms, develop shortness of breath, life threatening emergency, suicidal or homicidal thoughts you must seek medical attention immediately by calling 911 or calling your MD immediately  if symptoms less severe.  You Must read complete instructions/literature along with all the possible adverse reactions/side effects for all the Medicines you take and that have been prescribed to you. Take any new Medicines after you have completely understood and accept all the possible adverse reactions/side effects.   Please note  You were cared for by a hospitalist during your hospital stay. If you have any questions about your discharge medications or the care you received while you were in the hospital after you are discharged, you can call the unit and asked to speak with the hospitalist on call if  the hospitalist that took care of you is not available. Once you are discharged, your primary care physician will handle any further medical issues. Please note that NO REFILLS for any discharge medications will be authorized once you are discharged, as it is imperative that you return to your primary care physician (or establish a relationship with a primary care physician if you do not have one)  for your aftercare needs so that they can reassess your need for medications and monitor your lab values.    Today   CHIEF COMPLAINT:   Chief Complaint  Patient presents with  . Dizziness    HISTORY OF PRESENT ILLNESS:  Nathan Evans  is a 60 y.o. male presented with nausea vomiting and unsteady gait and found to have an acute cerebellar stroke.   VITAL SIGNS:  Blood pressure (!) 135/98, pulse 77, temperature 98.5 F (36.9 C), temperature source Oral, resp. rate 17, height 6' (1.829 m), weight 90.5 kg, SpO2 99 %.  I/O:    Intake/Output Summary (Last 24 hours) at 01/10/2021 1609 Last data filed at 01/10/2021 1330 Gross per 24 hour  Intake 480 ml  Output --  Net 480 ml    PHYSICAL EXAMINATION:  GENERAL:  60 y.o.-year-old patient lying in the bed with no acute distress.  EYES: Pupils equal, round, reactive to light and accommodation. No scleral icterus. Extraocular muscles intact.  HEENT: Head atraumatic, normocephalic. Oropharynx and nasopharynx clear.  LUNGS: Normal breath sounds bilaterally, no wheezing, rales,rhonchi or crepitation. No use of accessory muscles of respiration.  CARDIOVASCULAR: S1, S2 normal. No murmurs, rubs, or gallops.  ABDOMEN: Soft, non-tender, non-distended.  EXTREMITIES: No pedal edema.  NEUROLOGIC: Cranial nerves II through XII are intact. Muscle strength 5/5 in all extremities. Sensation intact. Gait slow and steady seen with physical therapy.  Heel shin intact bilaterally.  Finger-nose intact bilaterally. PSYCHIATRIC: The patient is alert and oriented x 3.  SKIN: No obvious rash, lesion, or ulcer.   DATA REVIEW:   CBC Recent Labs  Lab 01/10/21 0321  WBC 6.8  HGB 14.7  HCT 44.2  PLT 195    Chemistries  Recent Labs  Lab 01/10/21 0321  NA 137  K 3.6  CL 105  CO2 23  GLUCOSE 105*  BUN 18  CREATININE 1.25*  CALCIUM 8.9  MG 2.2    Microbiology Results  Results for orders placed or performed during the hospital encounter  of 01/09/21  Resp Panel by RT-PCR (Flu A&B, Covid) Nasopharyngeal Swab     Status: None   Collection Time: 01/09/21  2:12 PM   Specimen: Nasopharyngeal Swab; Nasopharyngeal(NP) swabs in vial transport medium  Result Value Ref Range Status   SARS Coronavirus 2 by RT PCR NEGATIVE NEGATIVE Final    Comment: (NOTE) SARS-CoV-2 target nucleic acids are NOT DETECTED.  The SARS-CoV-2 RNA is generally detectable in upper respiratory specimens during the acute phase of infection. The lowest concentration of SARS-CoV-2 viral copies this assay can detect is 138 copies/mL. A negative result does not preclude SARS-Cov-2 infection and should not be used as the sole basis for treatment or other patient management decisions. A negative result may occur with  improper specimen collection/handling, submission of specimen other than nasopharyngeal swab, presence of viral mutation(s) within the areas targeted by this assay, and inadequate number of viral copies(<138 copies/mL). A negative result must be combined with clinical observations, patient history, and epidemiological information. The expected result is Negative.  Fact Sheet for Patients:  BloggerCourse.com  Fact Sheet  for Healthcare Providers:  SeriousBroker.it  This test is no t yet approved or cleared by the [place] and  has been authorized for detection and/or diagnosis of SARS-CoV-2 by FDA under an Emergency Use Authorization (EUA). This EUA will remain  in effect (meaning this test can be used) for the duration of the COVID-19 declaration under Section 564(b)(1) of the Act, 21 U.S.C.section 360bbb-3(b)(1), unless the authorization is terminated  or revoked sooner.       Influenza A by PCR NEGATIVE NEGATIVE Final   Influenza B by PCR NEGATIVE NEGATIVE Final    Comment: (NOTE) The Xpert Xpress SARS-CoV-2/FLU/RSV plus assay is intended as an aid in the diagnosis of influenza  from Nasopharyngeal swab specimens and should not be used as a sole basis for treatment. Nasal washings and aspirates are unacceptable for Xpert Xpress SARS-CoV-2/FLU/RSV testing.  Fact Sheet for Patients: BloggerCourse.com  Fact Sheet for Healthcare Providers: SeriousBroker.it  This test is not yet approved or cleared by the [place] FDA and has been authorized for detection and/or diagnosis of SARS-CoV-2 by FDA under an Emergency Use Authorization (EUA). This EUA will remain in effect (meaning this test can be used) for the duration of the COVID-19 declaration under Section 564(b)(1) of the Act, 21 U.S.C. section 360bbb-3(b)(1), unless the authorization is terminated or revoked.  Performed at [facility], [address], [place], [place] [address]     RADIOLOGY:  MR ANGIO HEAD WO CONTRAST  Result Date: 01/09/2021 CLINICAL DATA:  Dizziness, nonspecific. Additional history provided: Patient reports nausea/vomiting with dizziness, diaphoresis since last night. EXAM: MRI HEAD WITHOUT CONTRAST MRA HEAD WITHOUT CONTRAST TECHNIQUE: Multiplanar, multi-echo pulse sequences of the brain and surrounding structures were acquired without intravenous contrast. Angiographic images of the Circle of Willis were acquired using MRA technique without intravenous contrast. COMPARISON: No pertinent prior exam. COMPARISON:  Report from brain MRI 01/21/2000 (images unavailable). FINDINGS: MRI HEAD FINDINGS Brain: Cerebral volume is normal for age. There are small acute infarcts within the inferomedial right cerebellum measuring up to 7 mm (PICA territory) (series 5, image 11) (series 5, images 9-10). Mild multifocal T2/FLAIR hyperintensity within the cerebral white matter, nonspecific but compatible with chronic small vessel ischemic disease. No evidence of intracranial mass. No chronic intracranial blood products. No extra-axial fluid collection.  No midline shift. Vascular: Expected proximal arterial flow voids. Skull and upper cervical spine: No focal marrow lesion. Sinuses/Orbits: Visualized orbits show no acute finding. Mild mucosal thickening within the right frontal sinus. Moderate mucosal thickening within the left frontal sinus. Moderate bilateral ethmoid sinus mucosal thickening. Tiny left maxillary sinus mucous retention cyst. MRA HEAD FINDINGS Anterior circulation: The intracranial internal carotid arteries are patent. The M1 middle cerebral arteries are patent. No M2 proximal branch occlusion or high-grade proximal stenosis is identified. The A1 right anterior cerebral artery is developmentally absent or markedly hypoplastic. The anterior cerebral arteries are otherwise patent. 1-2 mm posteriorly projecting vascular protrusion arising from the paraclinoid right ICA, which may reflect an aneurysm or infundibulum (series 1, image 109). Posterior circulation: The intracranial vertebral arteries are patent. The left vertebral artery is slightly dominant. The basilar artery is patent. Flow related signal is present within the proximal right PICA. However, there is an apparent severe stenosis within the proximal right PICA (series 1041, image 15). The posterior cerebral arteries are patent. Posterior communicating arteries are hypoplastic or absent bilaterally. Anatomic variants: As described IMPRESSION: MRI brain: 1. Small acute infarcts within the inferomedial right cerebellum, measuring up  to 7 mm (PICA territory). 2. Mild cerebral white matter chronic small vessel ischemic disease. 3. Paranasal sinus disease, as described. MRA head: 1. No intracranial large vessel occlusion. 2. 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 3. 1-2 mm posteriorly projecting vascular protrusion arising from the paraclinoid right ICA, which may reflect an aneurysm or infundibulum. Electronically Signed   By: Jackey Loge DO   On: 01/09/2021 13:58   MR ANGIO  NECK W WO CONTRAST  Result Date: 01/09/2021 CLINICAL DATA:  Right cerebellar stroke EXAM: MRA NECK WITHOUT AND WITH CONTRAST TECHNIQUE: Multiplanar and multiecho pulse sequences of the neck were obtained without and with intravenous contrast. Angiographic images of the neck were obtained using MRA technique without and with intravenous contrast. CONTRAST:  49mL GADAVIST GADOBUTROL 1 MMOL/ML IV SOLN COMPARISON:  MRI studies earlier today. FINDINGS: Branching pattern from the arch is normal. Common origin of the innominate artery and left common carotid artery. Both common carotid arteries are widely patent to the bifurcation. Both carotid bifurcations appear normal. No stenosis or irregularity. No proximal subclavian disease seen on either side. Both vertebral artery origins appear normal. The left vertebral artery is dominant. Both vertebral arteries appear normal through their course to the basilar. Resolution is not sufficient to accurately evaluate the detailed PICA anatomy. IMPRESSION: Normal MR angiography of the neck vessels. No vertebral artery pathology identified. Resolution is not sufficient to accurately evaluate PICA detail. Electronically Signed   By: Paulina Fusi M.D.   On: 01/09/2021 19:54   MR BRAIN WO CONTRAST  Result Date: 01/09/2021 CLINICAL DATA:  Dizziness, nonspecific. Additional history provided: Patient reports nausea/vomiting with dizziness, diaphoresis since last night. EXAM: MRI HEAD WITHOUT CONTRAST MRA HEAD WITHOUT CONTRAST TECHNIQUE: Multiplanar, multi-echo pulse sequences of the brain and surrounding structures were acquired without intravenous contrast. Angiographic images of the Circle of Willis were acquired using MRA technique without intravenous contrast. COMPARISON: No pertinent prior exam. COMPARISON:  Report from brain MRI 01/21/2000 (images unavailable). FINDINGS: MRI HEAD FINDINGS Brain: Cerebral volume is normal for age. There are small acute infarcts within the  inferomedial right cerebellum measuring up to 7 mm (PICA territory) (series 5, image 11) (series 5, images 9-10). Mild multifocal T2/FLAIR hyperintensity within the cerebral white matter, nonspecific but compatible with chronic small vessel ischemic disease. No evidence of intracranial mass. No chronic intracranial blood products. No extra-axial fluid collection. No midline shift. Vascular: Expected proximal arterial flow voids. Skull and upper cervical spine: No focal marrow lesion. Sinuses/Orbits: Visualized orbits show no acute finding. Mild mucosal thickening within the right frontal sinus. Moderate mucosal thickening within the left frontal sinus. Moderate bilateral ethmoid sinus mucosal thickening. Tiny left maxillary sinus mucous retention cyst. MRA HEAD FINDINGS Anterior circulation: The intracranial internal carotid arteries are patent. The M1 middle cerebral arteries are patent. No M2 proximal branch occlusion or high-grade proximal stenosis is identified. The A1 right anterior cerebral artery is developmentally absent or markedly hypoplastic. The anterior cerebral arteries are otherwise patent. 1-2 mm posteriorly projecting vascular protrusion arising from the paraclinoid right ICA, which may reflect an aneurysm or infundibulum (series 1, image 109). Posterior circulation: The intracranial vertebral arteries are patent. The left vertebral artery is slightly dominant. The basilar artery is patent. Flow related signal is present within the proximal right PICA. However, there is an apparent severe stenosis within the proximal right PICA (series 1041, image 15). The posterior cerebral arteries are patent. Posterior communicating arteries are hypoplastic or absent bilaterally. Anatomic variants: As described  IMPRESSION: MRI brain: 1. Small acute infarcts within the inferomedial right cerebellum, measuring up to 7 mm (PICA territory). 2. Mild cerebral white matter chronic small vessel ischemic disease. 3.  Paranasal sinus disease, as described. MRA head: 1. No intracranial large vessel occlusion. 2. 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 3. 1-2 mm posteriorly projecting vascular protrusion arising from the paraclinoid right ICA, which may reflect an aneurysm or infundibulum. Electronically Signed   By: Jackey Loge DO   On: 01/09/2021 13:58   ECHOCARDIOGRAM COMPLETE BUBBLE STUDY  Result Date: 01/10/2021    ECHOCARDIOGRAM REPORT   Patient Name:   TADD HOLTMEYER Lotts Date of Exam: 01/10/2021 Medical Rec #:  [id number]             Height:       72.0 in Accession #:    [id number]            Weight:       199.6 lb Date of Birth:  1961-03-20             BSA:          2.129 m Patient Age:    59 years              BP:           138/93 mmHg Patient Gender: M                     HR:           78 bpm. Exam Location:  [facility] Procedure: 2D Echo, Color Doppler, Cardiac Doppler and Saline Contrast Bubble            Study Indications:     I63.9 Stroke  History:         Patient has no prior history of Echocardiogram examinations.                  Risk Factors:Hypertension and Dyslipidemia.  Sonographer:     Humphrey Rolls RDCS (AE) Referring Phys:  [id number] [facility] Diagnosing Phys: Arnoldo Hooker MD  Sonographer Comments: Suboptimal subcostal window. IMPRESSIONS  1. Left ventricular ejection fraction, by estimation, is 65 to 70%. The left ventricle has normal function. The left ventricle has no regional wall motion abnormalities. Left ventricular diastolic parameters were normal.  2. Right ventricular systolic function is normal. The right ventricular size is normal.  3. The mitral valve is normal in structure. Trivial mitral valve regurgitation.  4. The aortic valve is normal in structure. Aortic valve regurgitation is not visualized. FINDINGS  Left Ventricle: Left ventricular ejection fraction, by estimation, is 65 to 70%. The left ventricle has normal function. The left ventricle has no regional wall motion  abnormalities. The left ventricular internal cavity size was small. There is no left ventricular hypertrophy. Left ventricular diastolic parameters were normal. Right Ventricle: The right ventricular size is normal. No increase in right ventricular wall thickness. Right ventricular systolic function is normal. Left Atrium: Left atrial size was normal in size. Right Atrium: Right atrial size was normal in size. Pericardium: There is no evidence of pericardial effusion. Mitral Valve: The mitral valve is normal in structure. Trivial mitral valve regurgitation. MV peak gradient, 3.7 mmHg. The mean mitral valve gradient is 2.0 mmHg. Tricuspid Valve: The tricuspid valve is normal in structure. Tricuspid valve regurgitation is trivial. Aortic Valve: The aortic valve is normal in structure. Aortic valve regurgitation is not visualized. Aortic valve mean gradient measures 4.0 mmHg.  Aortic valve peak gradient measures 9.6 mmHg. Aortic valve area, by VTI measures 1.91 cm. Pulmonic Valve: The pulmonic valve was normal in structure. Pulmonic valve regurgitation is not visualized. Aorta: The aortic root and ascending aorta are structurally normal, with no evidence of dilitation. IAS/Shunts: No atrial level shunt detected by color flow Doppler. Agitated saline contrast was given intravenously to evaluate for intracardiac shunting.  LEFT VENTRICLE PLAX 2D LVIDd:         3.80 cm  Diastology LVIDs:         1.80 cm  LV e' medial:    6.09 cm/s LV PW:         0.90 cm  LV E/e' medial:  9.4 LV IVS:        0.80 cm  LV e' lateral:   11.90 cm/s LVOT diam:     2.20 cm  LV E/e' lateral: 4.8 LV SV:         50 LV SV Index:   23 LVOT Area:     3.80 cm  RIGHT VENTRICLE RV Basal diam:  2.80 cm TAPSE (M-mode): 2.5 cm LEFT ATRIUM             Index       RIGHT ATRIUM          Index LA diam:        2.70 cm 1.27 cm/m  RA Area:     8.88 cm LA Vol (A2C):   32.1 ml 15.08 ml/m RA Volume:   18.70 ml 8.78 ml/m LA Vol (A4C):   33.7 ml 15.83 ml/m LA  Biplane Vol: 34.8 ml 16.35 ml/m  AORTIC VALVE                   PULMONIC VALVE AV Area (Vmax):    2.02 cm    PV Vmax:       1.36 m/s AV Area (Vmean):   2.07 cm    PV Vmean:      81.600 cm/s AV Area (VTI):     1.91 cm    PV VTI:        0.221 m AV Vmax:           155.00 cm/s PV Peak grad:  7.4 mmHg AV Vmean:          98.300 cm/s PV Mean grad:  3.0 mmHg AV VTI:            0.261 m AV Peak Grad:      9.6 mmHg AV Mean Grad:      4.0 mmHg LVOT Vmax:         82.20 cm/s LVOT Vmean:        53.600 cm/s LVOT VTI:          0.131 m LVOT/AV VTI ratio: 0.50 MITRAL VALVE MV Area (PHT): 4.39 cm    SHUNTS MV Area VTI:   2.53 cm    Systemic VTI:  0.13 m MV Peak grad:  3.7 mmHg    Systemic Diam: 2.20 cm MV Mean grad:  2.0 mmHg MV Vmax:       0.96 m/s MV Vmean:      61.7 cm/s MV Decel Time: 173 msec MV E velocity: 57.40 cm/s MV A velocity: 67.70 cm/s MV E/A ratio:  0.85 Arnoldo HookerBruce Kowalski MD Electronically signed by Arnoldo HookerBruce Kowalski MD Signature Date/Time: 01/10/2021/3:20:28 PM    Final      Management plans discussed with the patient, family and they are in agreement.  CODE STATUS:     Code Status Orders  (From admission, onward)         Start     Ordered   01/09/21 1539  Full code  Continuous        01/09/21 1538        Code Status History    This patient has a current code status but no historical code status.   Advance Care Planning Activity      TOTAL TIME TAKING CARE OF THIS PATIENT: 35 minutes.    Alford Highland M.D on 01/10/2021 at 4:09 PM  Between 7am to 6pm - Pager - [phone]  After 6pm go to [email or web address] - password [email or web address]  [facility]  CC: Primary care physician; Pcp, No

## 2021-01-10 NOTE — Evaluation (Signed)
Physical Therapy Evaluation Patient Details Name: Nathan Evans MRN: [id number] DOB: [birth year] Today's Date: 01/10/2021   History of Present Illness  Pt is a 60 y/o M with hyperlipidemia and hypertension, not currently taking any medications, who presented wtih c/o feeling of disequilibrium, N/V, and dizziness. On MRI, pt found to have small acute infarcts within the inferomedial right cerebellum.    Clinical Impression  Pt was pleasant and motivated to participate during the session and overall performed very well during the session.  Pt presented with only minor deficits in stability including decreased LLE single leg stance time, (+) Romberg sign, and mild drifting during dynamic gait with head turns.  Pt reported feeling slightly less steady subjectively during ambulation as well.  Pt reported no pain and no adverse symptoms during the session.  Pt will benefit from OPPT upon discharge to safely address deficits listed in patient problem list for return to PLOF.      Follow Up Recommendations Outpatient PT    Equipment Recommendations  None recommended by PT    Recommendations for Other Services       Precautions / Restrictions Precautions Precautions: None Restrictions Weight Bearing Restrictions: No      Mobility  Bed Mobility Overal bed mobility: Independent                  Transfers Overall transfer level: Independent               General transfer comment: Good eccentric control and stability  Ambulation/Gait Ambulation/Gait assistance: Independent Gait Distance (Feet): 400 Feet Assistive device: None Gait Pattern/deviations: WFL(Within Functional Limits) Gait velocity: WNL   General Gait Details: Steady amb with good cadence without an AD; during dynamic gait with start/stops and rapid 180 deg turns pt also steady; min drifting left/right during amb with head turns as well as slowed cadence but no LOB  Stairs Stairs: Yes Stairs  assistance: Independent Stair Management: No rails;Forwards;Alternating pattern Number of Stairs: 5 General stair comments: Pt self-selected no rails and was steady ascending and descending steps with good eccentric and concentric control  Wheelchair Mobility    Modified Rankin (Stroke Patients Only)       Balance Overall balance assessment: Mild deficits observed, not formally tested;Needs assistance               Single Leg Stance - Right Leg: 10 Single Leg Stance - Left Leg: 8         High level balance activites: Sudden stops;Head turns;Direction changes High Level Balance Comments: Mild drifting and slowed cadence with head turns; (+) Romberg sign with significant sway with feet together and eyes closed but pt able to self-correct without assistance             Pertinent Vitals/Pain Pain Assessment: No/denies pain    Home Living Family/patient expects to be discharged to:: Private residence Living Arrangements: Alone Available Help at Discharge: Family;Friend(s);Available 24 hours/day Type of Home: House Home Access: Stairs to enter Entrance Stairs-Rails: Right;Left;Can reach both Entrance Stairs-Number of Steps: 3 Home Layout: One level Home Equipment: None      Prior Function Level of Independence: Independent         Comments: Ind amb without an AD community distances, no fall history, Ind with ADLs, works FT as a Immunologist, 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 MMT 5/5 LUE Deficits / Details: very slight decrease in speed on rapid alternating movement test, very minimal c/o numbness on medial nerve side of L hand. MMT still grossly 5/5. Able to functionally utilize L hand as he is L handed: writes name clearly and reports his handwriting is at  baseline. LUE Coordination: decreased fine motor    Lower Extremity Assessment Lower Extremity Assessment: Overall WFL for tasks assessed RLE Deficits / Details: MMT 5/5 throughout RLE Sensation: WNL RLE Coordination: WNL LLE Deficits / Details: MMT 5/5 throughout LLE Sensation: WNL LLE Coordination: WNL       Communication   Communication: No difficulties  Cognition Arousal/Alertness: Awake/alert Behavior During Therapy: WFL for tasks assessed/performed Overall Cognitive Status: Within Functional Limits for tasks assessed                                        General Comments General comments (skin integrity, edema, etc.): pt with perfect static standing balance in R LE single leg stance, very minimal LOB in L LE single leg stance. G dynamic sitting and standing balance with challenge when using B LEs for BOS. No AD required.    Exercises Other Exercises Other Exercises: Pt education provided on corner balance exercises, how to progress intensity, and importance of spotter while performing exercises   Assessment/Plan    PT Assessment Patient needs continued PT services  PT Problem List Decreased balance       PT Treatment Interventions Gait training;Stair training;Therapeutic activities;Therapeutic exercise;Balance training;Neuromuscular re-education;Patient/family education    PT Goals (Current goals can be found in the Care Plan section)  Acute Rehab PT Goals Patient Stated Goal: To be able to resume working, driving and all activity PT Goal Formulation: With patient Time For Goal Achievement: 01/23/21 Potential to Achieve Goals: Good    Frequency Min 2X/week   Barriers to discharge        Co-evaluation               AM-PAC PT "6 Clicks" Mobility  Outcome Measure Help needed turning from your back to your side while in a flat bed without using bedrails?: None Help needed moving from lying on your back to sitting on the side of a flat  bed without using bedrails?: None Help needed moving to and from a bed to a chair (including a wheelchair)?: None Help needed standing up from a chair using your arms (e.g., wheelchair or bedside chair)?: None Help needed to walk in hospital room?: None Help needed climbing 3-5 steps with a railing? : None 6 Click Score: 24    End of Session Equipment Utilized During Treatment: Gait belt Activity Tolerance: Patient tolerated treatment well Patient left: in chair;with call bell/phone within reach Nurse Communication: Mobility status PT Visit Diagnosis: Unsteadiness on feet (R26.81)    Time: 1000-1031 PT Time Calculation (min) (ACUTE ONLY): 31 min   Charges:   PT Evaluation $PT Eval Low Complexity: 1 Low PT Treatments $Therapeutic Exercise: 8-22 mins        D. Scott Gloriajean Okun PT, DPT 01/10/21, 11:34 AM

## 2021-01-10 NOTE — Progress Notes (Signed)
Patient discharged to home.  Verbalizes understanding of discharge instructions. 

## 2021-01-10 NOTE — Discharge Instructions (Addendum)
Take plavix and aspirin together for thirty days, after 30 days just take Plavix alone and stop aspirin  Consider event monitor as outpatient with follow up appointment

## 2021-01-10 NOTE — Progress Notes (Signed)
*  PRELIMINARY RESULTS* Echocardiogram 2D Echocardiogram has been performed.  Jasilyn Holderman M Tallan Sandoz 01/10/2021, 10:32 AM 

## 2021-01-10 NOTE — Consult Note (Signed)
NEUROLOGY CONSULTATION NOTE   Date of service: Jan 10, 2021 Patient Name: Nathan Evans MRN:  [id number] DOB:  10-Apr-1961 Reason for consult: acute infarct on MRI _ _ _   _ __   _ __ _ _  __ __   _ __   __ _  History of Present Illness   PHILOPATEER STRINE is a 60 y.o. male with PMH significant for HTN and HL who presented to ED 01/09/21 c/o 24 hrs dysequilibrium with fluctuating dizziness associated with nausea and diaphoresis and was found to have R cerebellar acute ischemic infarct. He does not take any medications as o/p. BP on arrival to ED was 193/104. No focal neuro deficits noted on EDP exam. Pt states sx have almost completely resolved. He has brief episodes of dizziness with nausea but much more mild than before. No incoordination, ambulating well.  Data:  MRI brain:  1. Small acute infarcts within the inferomedial right cerebellum, measuring up to 7 mm (PICA territory). 2. Mild cerebral white matter chronic small vessel ischemic disease. 3. Paranasal sinus disease, as described.  MRA head:  1. No intracranial large vessel occlusion. 2. 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 3. 1-2 mm posteriorly projecting vascular protrusion arising from the paraclinoid right ICA, which may reflect an aneurysm or Infundibulum.  MRA neck:  Normal MR angiography of the neck vessels. No vertebral artery pathology identified. Resolution is not sufficient to accurately evaluate PICA detail.  CTA not performed 2/2 contrast shortage  CNS imaging personally reviewed.   TTE pending  LDL 130  A1c 5.9    ROS   10 point review of systems was performed and was negative except as described in HPI.  Past History   Past Medical History:  Diagnosis Date  . Hyperlipidemia   . Hypertension    History reviewed. No pertinent surgical history. History reviewed. No pertinent family history. Social History   Socioeconomic History  . Marital status:  Married    Spouse name: Not on file  . Number of children: Not on file  . Years of education: Not on file  . Highest education level: Not on file  Occupational History  . Not on file  Tobacco Use  . Smoking status: Never Smoker  . Smokeless tobacco: Never Used  Substance and Sexual Activity  . Alcohol use: Not on file  . Drug use: Not on file  . Sexual activity: Not on file  Other Topics Concern  . Not on file  Social History Narrative  . Not on file   Social Determinants of Health   Financial Resource Strain: Not on file  Food Insecurity: Not on file  Transportation Needs: Not on file  Physical Activity: Not on file  Stress: Not on file  Social Connections: Not on file   No Known Allergies  Medications   Medications Prior to Admission  Medication Sig Dispense Refill Last Dose  . aspirin 81 MG EC tablet Take 81 mg by mouth daily.   01/07/2021 at [facility]  . fexofenadine (ALLEGRA) 180 MG tablet Take 180 mg by mouth daily.     . hydrochlorothiazide (HYDRODIURIL) 25 MG tablet Take 1 tablet by mouth daily. (Patient not taking: Reported on 01/09/2021)   Not Taking at Unknown time  . simvastatin (ZOCOR) 20 MG tablet Take 20 mg by mouth daily. (Patient not taking: Reported on 01/09/2021)   Not Taking at Unknown time     Vitals   Vitals:   01/10/21 0107  01/10/21 0210 01/10/21 0400 01/10/21 0739  BP: 134/88 133/88 (!) 138/93 136/83  Pulse: 81 75 69 77  Resp: 20   18  Temp: 98.4 F (36.9 C) 98.3 F (36.8 C) 98.5 F (36.9 C) 98.3 F (36.8 C)  TempSrc: Oral Oral Oral Oral  SpO2: 98%  100% 100%  Weight:      Height:         Body mass index is 27.07 kg/m.  Physical Exam   Physical Exam Gen: A&O x4, NAD HEENT: Atraumatic, normocephalic;mucous membranes moist; oropharynx clear, tongue without atrophy or fasciculations. Neck: Supple, trachea midline. Resp: CTAB, no w/r/r CV: RRR, no m/g/r; nml S1 and S2. 2+ symmetric peripheral pulses. Abd: soft/NT/ND; nabs x 4  quad Extrem: Nml bulk; no cyanosis, clubbing, or edema.  Neuro: *MS: A&O x4. Follows multi-step commands.  *Speech: fluid, nondysarthric, able to name and repeat *CN:    I: Deferred   II,III: PERRLA, VFF by confrontation, optic discs sharp   III,IV,VI: EOMI w/o nystagmus, no ptosis   V: Sensation intact from V1 to V3 to LT   VII: Eyelid closure was full.  Smile symmetric.   VIII: Hearing intact to voice   IX,X: Voice normal, palate elevates symmetrically    XI: SCM/trap 5/5 bilat   XII: Tongue protrudes midline, no atrophy or fasciculations   *Motor:   Normal bulk.  No tremor, rigidity or bradykinesia. No pronator drift.    Strength: Dlt Bic Tri WrE WrF FgS Gr HF KnF KnE PlF DoF    Left 5 5 5 5 5 5 5 5 5 5 5 5     Right 5 5 5 5 5 5 5 5 5 5 5 5     *Sensory: Intact to light touch, pinprick, temperature vibration throughout. Symmetric. Propioception intact bilat.  No double-simultaneous extinction.  *Coordination:  Finger-to-nose, heel-to-shin intact.  *Reflexes:  2+ and symmetric throughout without clonus; toes down-going bilat *Gait: deferred; ambulated well with PT this AM  NIHSS = 0   Premorbid mRS = 0   Labs   CBC:  Recent Labs  Lab 01/09/21 1027 01/10/21 0321  WBC 6.5 6.8  HGB 15.4 14.7  HCT 46.2 44.2  MCV 87.7 88.6  PLT 202 195    Basic Metabolic Panel:  Lab Results  Component Value Date   NA 137 01/10/2021   K 3.6 01/10/2021   CO2 23 01/10/2021   GLUCOSE 105 (H) 01/10/2021   BUN 18 01/10/2021   CREATININE 1.25 (H) 01/10/2021   CALCIUM 8.9 01/10/2021   GFRNONAA >60 01/10/2021   Lipid Panel:  Lab Results  Component Value Date   LDLCALC 130 (H) 01/10/2021   HgbA1c:  Lab Results  Component Value Date   HGBA1C 5.9 (H) 01/09/2021   Urine Drug Screen: No results found for: LABOPIA, COCAINSCRNUR, LABBENZ, AMPHETMU, THCU, LABBARB  Alcohol Level No results found for: [facility]   Impression   ANEES VANECEK is a 60 y.o. male with PMH significant  for HTN and HL who presented to ED 01/09/21 c/o 24 hrs dysequilibrium with fluctuating dizziness associated with nausea and diaphoresis and was found to have R cerebellar acute ischemic infarct. Patient is recovering well with minimal deficits  Recommendations   - No indication for permissive HTN >48 hrs post-stroke. Goal normotension; avoid hypotension. - Continue ASA 81mg  daily + plavix 75mg  daily x30 days. After 30 days d/c ASA and continue on plavix 75mg  daily (pt was on ASA prior to admission) - Atorvastatin increased to  80mg  daily for LDL 130 (goal <100) - PT/OT evaluations complete - Stroke education - F/u TTE. If no significant abnormalities and no intracardiac clot, no further neurologic w/u indicated as an inpatient - I will place amb referral to neurology to arrange outpatient followup; consider amb cardiac monitor at that time - Pt counseled that if they develop any new weakness, numbess, trouble walking, trouble talking, slurred speech, or a facial droop that they should immediately call 911 or go to their nearest emergency room. They expressed understanding. - No indication for driving restriction   ______________________________________________________________________   Thank you for the opportunity to take part in the care of this patient. If you have any further questions, please contact the neurology consultation attending.  Signed,  , MD [facility] [phone]  If 7pm- 7am, please page neurology on call as listed in AMION.

## 2021-04-09 ENCOUNTER — Ambulatory Visit: Payer: PRIVATE HEALTH INSURANCE | Admitting: Neurology

## 2021-06-04 ENCOUNTER — Ambulatory Visit (INDEPENDENT_AMBULATORY_CARE_PROVIDER_SITE_OTHER): Payer: PRIVATE HEALTH INSURANCE | Admitting: Neurology

## 2021-06-04 ENCOUNTER — Encounter: Payer: Self-pay | Admitting: Neurology

## 2021-06-04 VITALS — BP 147/77 | HR 77 | Ht 72.0 in | Wt 200.0 lb

## 2021-06-04 DIAGNOSIS — I639 Cerebral infarction, unspecified: Secondary | ICD-10-CM

## 2021-06-04 NOTE — Progress Notes (Signed)
[facility] [address] [place]. [place] [address] [phone]       OFFICE CONSULT NOTE  Mr. Nathan Evans Date of Birth:  1960/09/04 Medical Record Number:  [id number]   Referring MD: Bing Neighbors  Reason for Referral: Stroke  HPI: Mr. Nathan Evans is a 60 year old African-American male seen today for initial office consultation visit for stroke.  History is obtained from the patient and review of electronic medical records and I personally reviewed pertinent imaging films in PACS.  Mr. Nathan Evans has a past medical history significant for hypertension hyperlipidemia who presented to [facility] on 01/09/2021 with 24-hour history of intermittent fluctuating dizziness, nausea, vomiting and gait disequilibrium along with diaphoresis.  He was outside time window for thrombolysis.  BP on arrival was elevated at 193/104.  He had no focal deficits on exam and symptoms completely resolved shortly after arrival.  MRI scan showed small acute infarcts in the inferior medial right cerebellum measuring 7 mm in the PICA territory.  There are mild changes of small vessel disease.  MR angiogram of the brain showed no large vessel stenosis or occlusion.  Right PICA was poorly visualized.  There was 1 to 2 mm posterior projecting vascular protrusion from the paraclinoid right ICA possibly aneurysm or infundibulum.  MR angiogram of the neck did not show significant large vessel stenosis.  CT angiogram was not performed due to contrast shortage.  Transthoracic echo showed normal ejection fraction of 65 to 70% without any cardiac source of embolism.  LDL cholesterol was elevated at 130 mg percent and hemoglobin A1c was 5.9.  Patient did well and was discharged on dual antiplatelet therapy aspirin and Plavix which is completed and is currently now on Plavix alone.  Patient is tolerating it well with only minor bruising and no bleeding.  His blood pressure is doing better  today it is 147/77.  He is tolerating Lipitor well without muscle aches and pains.  He has had no recurrent stroke or TIA symptoms.  He has not yet been set up for work cardiac monitoring or TEE.  There is no family Nathan Evans strokes or heart attacks in a young age.  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  No complaints today  ROS:   14 system review of systems is positive for dizziness, nausea, vomiting, imbalance and all other systems negative  PMH:  Past Medical History:  Diagnosis Date   Hyperlipidemia    Hypertension     Social History:  Social History   Socioeconomic History   Marital status: Married    Spouse name: Not on file   Number of children: Not on file   Years of education: Not on file   Highest education level: Not on file  Occupational History   Not on file  Tobacco Use   Smoking status: Never   Smokeless tobacco: Never  Substance and Sexual Activity   Alcohol use: Not on file   Drug use: Not on file   Sexual activity: Not on file  Other Topics Concern   Not on file  Social History Narrative   Not on file   Social Determinants of Health   Financial Resource Strain: Not on file  Food Insecurity: Not on file  Transportation Needs: Not on file  Physical Activity: Not on file  Stress: Not on file  Social Connections: Not on file  Intimate Partner Violence: Not on file    Medications:   Current Outpatient  Medications on File Prior to Visit  Medication Sig Dispense Refill   amLODipine (NORVASC) 10 MG tablet Take 10 mg by mouth daily.     atorvastatin (LIPITOR) 80 MG tablet Take 1 tablet (80 mg total) by mouth daily. 30 tablet 1   clopidogrel (PLAVIX) 75 MG tablet Take 1 tablet (75 mg total) by mouth daily. 30 tablet 1   fexofenadine (ALLEGRA) 180 MG tablet Take 180 mg by mouth daily.     No current facility-administered medications on file prior to visit.    Allergies:  No Known Allergies  Physical Exam General: Mildly  overweight middle-aged African-American male ed, seated, in no evident distress Head: head normocephalic and atraumatic.   Neck: supple with no carotid or supraclavicular bruits Cardiovascular: regular rate and rhythm, no murmurs Musculoskeletal: no deformity Skin:  no rash/petichiae Vascular:  Normal pulses all extremities  Neurologic Exam Mental Status: Awake and fully alert. Oriented to place and time. Recent and remote memory intact. Attention span, concentration and fund of knowledge appropriate. Mood and affect appropriate.  Cranial Nerves: Fundoscopic exam reveals sharp disc margins. Pupils equal, briskly reactive to light. Extraocular movements full without nystagmus. Visual fields full to confrontation. Hearing intact. Facial sensation intact. Face, tongue, palate moves normally and symmetrically.  Motor: Normal bulk and tone. Normal strength in all tested extremity muscles. Sensory.: intact to touch , pinprick , position and vibratory sensation.  Coordination: Rapid alternating movements normal in all extremities. Finger-to-nose and heel-to-shin performed accurately bilaterally. Gait and Station: Arises from chair without difficulty. Stance is normal. Gait demonstrates normal stride length and balance . Able to heel, toe and tandem walk without difficulty.  Reflexes: 1+ and symmetric. Toes downgoing.   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  Vascular risk factors of hyperlipidemia, hypertension and mild overweight.     PLAN:I had a long d/w patient about his recent cryptogenic cerebellar stroke, risk for recurrent stroke/TIAs, personally independently reviewed imaging studies and stroke evaluation results and answered questions.Continue Plavix 75 mg daily for secondary stroke prevention and maintain strict control of hypertension with blood pressure goal below 130/90,  diabetes with hemoglobin A1c goal below 6.5% and lipids with LDL cholesterol goal below 70 mg/dL. I also advised the patient to eat a healthy diet with plenty of whole grains, cereals, fruits and vegetables, exercise regularly and maintain ideal body weight .check hypercoagulable panel, 30-day heart monitor for paroxysmal A. fib, TEE and transcranial Doppler bubble study.  Patient may also consider possible participation in the  [employer] stroke prevention trial if interested and will be given information to review and decide.  Followup in the future with me in 3 months or call earlier if necessary.  Greater than 50% time during this 45-minute consultation visit was spent in counseling and coordination of care about his cryptogenic stroke and discussion about evaluation treatment and prevention and answering questions. Delia Heady MD  Note: This document was prepared with digital dictation and possible smart phrase technology. Any transcriptional errors that result from this process are unintentional.

## 2021-06-04 NOTE — Patient Instructions (Addendum)
I had a long d/w patient about his recent cryptogenic cerebellar stroke, risk for recurrent stroke/TIAs, personally independently reviewed imaging studies and stroke evaluation results and answered questions.Continue Plavix 75 mg daily for secondary stroke prevention and maintain strict control of hypertension with blood pressure goal below 130/90, diabetes with hemoglobin A1c goal below 6.5% and lipids with LDL cholesterol goal below 70 mg/dL. I also advised the patient to eat a healthy diet with plenty of whole grains, cereals, fruits and vegetables, exercise regularly and maintain ideal body weight .check hypercoagulable panel, 30-day heart monitor for paroxysmal A. fib, TEE and transcranial Doppler bubble study.  Patient may also consider possible participation in the  [facility] stroke prevention trial if interested and will be given information to review and decide.  Followup in the future with me in 3 months or call earlier if necessary. Stroke Prevention Some medical conditions and behaviors are associated with a higher chance of having a stroke. You can help prevent a stroke by making nutrition, lifestyle, and other changes, including managing any medical conditions you may have. What nutrition changes can be made?  Eat healthy foods. You can do this by: Choosing foods high in fiber, such as fresh fruits and vegetables and whole grains. Eating at least 5 or more servings of fruits and vegetables a day. Try to fill half of your plate at each meal with fruits and vegetables. Choosing lean protein foods, such as lean cuts of meat, poultry without skin, fish, tofu, beans, and nuts. Eating low-fat dairy products. Avoiding foods that are high in salt (sodium). This can help lower blood pressure. Avoiding foods that have saturated fat, trans fat, and cholesterol. This can help prevent high cholesterol. Avoiding processed and premade foods. Follow your health care provider's specific guidelines for losing  weight, controlling high blood pressure (hypertension), lowering high cholesterol, and managing diabetes. These may include: Reducing your daily calorie intake. Limiting your daily sodium intake to 1,500 milligrams (mg). Using only healthy fats for cooking, such as olive oil, canola oil, or sunflower oil. Counting your daily carbohydrate intake. What lifestyle changes can be made? Maintain a healthy weight. Talk to your health care provider about your ideal weight. Get at least 30 minutes of moderate physical activity at least 5 days a week. Moderate activity includes brisk walking, biking, and swimming. Do not use any products that contain nicotine or tobacco, such as cigarettes and e-cigarettes. If you need help quitting, ask your health care provider. It may also be helpful to avoid exposure to secondhand smoke. Limit alcohol intake to no more than 1 drink a day for nonpregnant women and 2 drinks a day for men. One drink equals 12 oz of beer, 5 oz of wine, or 1 oz of hard liquor. Stop any illegal drug use. Avoid taking birth control pills. Talk to your health care provider about the risks of taking birth control pills if: You are over 28 years old. You smoke. You get migraines. You have ever had a blood clot. What other changes can be made? Manage your cholesterol levels. Eating a healthy diet is important for preventing high cholesterol. If cholesterol cannot be managed through diet alone, you may also need to take medicines. Take any prescribed medicines to control your cholesterol as told by your health care provider. Manage your diabetes. Eating a healthy diet and exercising regularly are important parts of managing your blood sugar. If your blood sugar cannot be managed through diet and exercise, you may need to take  medicines. Take any prescribed medicines to control your diabetes as told by your health care provider. Control your hypertension. To reduce your risk of stroke, try to  keep your blood pressure below 130/80. Eating a healthy diet and exercising regularly are an important part of controlling your blood pressure. If your blood pressure cannot be managed through diet and exercise, you may need to take medicines. Take any prescribed medicines to control hypertension as told by your health care provider. Ask your health care provider if you should monitor your blood pressure at home. Have your blood pressure checked every year, even if your blood pressure is normal. Blood pressure increases with age and some medical conditions. Get evaluated for sleep disorders (sleep apnea). Talk to your health care provider about getting a sleep evaluation if you snore a lot or have excessive sleepiness. Take over-the-counter and prescription medicines only as told by your health care provider. Aspirin or blood thinners (antiplatelets or anticoagulants) may be recommended to reduce your risk of forming blood clots that can lead to stroke. Make sure that any other medical conditions you have, such as atrial fibrillation or atherosclerosis, are managed. What are the warning signs of a stroke? The warning signs of a stroke can be easily remembered as BEFAST. B is for balance. Signs include: Dizziness. Loss of balance or coordination. Sudden trouble walking. E is for eyes. Signs include: A sudden change in vision. Trouble seeing. F is for face. Signs include: Sudden weakness or numbness of the face. The face or eyelid drooping to one side. A is for arms. Signs include: Sudden weakness or numbness of the arm, usually on one side of the body. S is for speech. Signs include: Trouble speaking (aphasia). Trouble understanding. T is for time. These symptoms may represent a serious problem that is an emergency. Do not wait to see if the symptoms will go away. Get medical help right away. Call your local emergency services (911 in the [place] Do not drive yourself to the hospital. Other  signs of stroke may include: A sudden, severe headache with no known cause. Nausea or vomiting. Seizure. Where to find more information For more information, visit: American Stroke Association: www.strokeassociation.org National Stroke Association: www.stroke.org Summary You can prevent a stroke by eating healthy, exercising, not smoking, limiting alcohol intake, and managing any medical conditions you may have. Do not use any products that contain nicotine or tobacco, such as cigarettes and e-cigarettes. If you need help quitting, ask your health care provider. It may also be helpful to avoid exposure to secondhand smoke. Remember BEFAST for warning signs of stroke. Get help right away if you or a loved one has any of these signs. This information is not intended to replace advice given to you by your health care provider. Make sure you discuss any questions you have with your health care provider. Document Revised: 07/30/2017 Document Reviewed: 09/22/2016 Elsevier Patient Education  2021 [employer].

## 2021-06-13 LAB — HYPERCOAGULABLE PANEL, COMPREHENSIVE
APTT: 24.8 s
AT III Act/Nor PPP Chro: 84 %
Act. Prt C Resist w/FV Defic.: 2.6 ratio
Anticardiolipin Ab, IgG: 13 [GPL'U]
Anticardiolipin Ab, IgM: 13 [MPL'U]
Beta-2 Glycoprotein I, IgA: <10 SAU
Beta-2 Glycoprotein I, IgG: <10 SGU
Beta-2 Glycoprotein I, IgM: <10 SMU
DRVVT Screen Seconds: 30.8 s
Factor VII Antigen**: 69 %
Factor VIII Activity: 191 % — ABNORMAL HIGH
Hexagonal Phospholipid Neutral: 0 s
Homocysteine: 12.3 umol/L
Prot C Ag Act/Nor PPP Imm: 85 %
Prot S Ag Act/Nor PPP Imm: 83 %
Protein C Ag/FVII Ag Ratio**: 1.2 ratio
Protein S Ag/FVII Ag Ratio**: 1.2 ratio

## 2021-06-16 ENCOUNTER — Telehealth: Payer: Self-pay | Admitting: *Deleted

## 2021-06-16 NOTE — Telephone Encounter (Signed)
I spoke to the patient and notified him of the lab results. 

## 2021-06-16 NOTE — Telephone Encounter (Signed)
-----   Message from Micki Riley, MD sent at 06/13/2021  4:11 PM EDT ----- Joneen Roach inform the patient that hypercoagulable panel labs were satisfactory ----- Message ----- From: Interface, [facility] Lab Results In Sent: 06/13/2021   5:37 AM EDT To: Micki Riley, MD

## 2021-06-30 ENCOUNTER — Ambulatory Visit: Payer: PRIVATE HEALTH INSURANCE | Admitting: Neurology

## 2021-06-30 ENCOUNTER — Other Ambulatory Visit (HOSPITAL_COMMUNITY): Payer: Self-pay | Admitting: Neurology

## 2021-06-30 ENCOUNTER — Ambulatory Visit (HOSPITAL_COMMUNITY)
Admission: RE | Admit: 2021-06-30 | Discharge: 2021-06-30 | Disposition: A | Discharge status: Home / Self Care | Payer: 59 | Source: Ambulatory Visit | Attending: Neurology | Admitting: Neurology

## 2021-06-30 ENCOUNTER — Other Ambulatory Visit: Payer: Self-pay

## 2021-06-30 DIAGNOSIS — I639 Cerebral infarction, unspecified: Secondary | ICD-10-CM | POA: Diagnosis present

## 2021-06-30 NOTE — Progress Notes (Signed)
TCD bubble study completed. Refer to "CV Proc" under chart review to view preliminary results.  06/30/2021 3:05 PM Eula Fried., MHA, RVT, RDCS, RDMS

## 2021-07-01 ENCOUNTER — Ambulatory Visit: Payer: PRIVATE HEALTH INSURANCE | Admitting: Diagnostic Neuroimaging

## 2021-07-04 ENCOUNTER — Telehealth: Payer: Self-pay | Admitting: *Deleted

## 2021-07-04 NOTE — Telephone Encounter (Signed)
I spoke to the patient and he verbalized understanding of the results. 

## 2021-07-04 NOTE — Telephone Encounter (Signed)
-----   Message from Micki Riley, MD sent at 07/03/2021  5:12 PM EDT ----- Joneen Roach inform the patient that transcranial Doppler bubble study was negative without any evidence of right-to-left shunt or hole in the heart. ----- Message ----- From: Interface, [facility] Sent: 06/30/2021   3:07 PM EDT To: Micki Riley, MD

## 2021-10-06 ENCOUNTER — Ambulatory Visit: Payer: PRIVATE HEALTH INSURANCE | Admitting: Neurology

## 2021-10-06 ENCOUNTER — Encounter: Payer: Self-pay | Admitting: Neurology

## 2022-01-14 ENCOUNTER — Telehealth: Payer: Self-pay | Admitting: Neurology

## 2022-01-14 NOTE — Telephone Encounter (Signed)
Rescheduled 7/10 appt with pt over the phone. ?

## 2022-03-09 ENCOUNTER — Ambulatory Visit: Payer: PRIVATE HEALTH INSURANCE | Admitting: Neurology

## 2022-03-26 ENCOUNTER — Encounter: Payer: Self-pay | Admitting: Neurology

## 2022-03-26 ENCOUNTER — Telehealth: Payer: Self-pay | Admitting: Neurology

## 2022-03-26 ENCOUNTER — Ambulatory Visit: Payer: PRIVATE HEALTH INSURANCE | Admitting: Neurology

## 2022-03-26 NOTE — Telephone Encounter (Signed)
Pt called at 3:31 pm. Pt got lost coming to appt. Rescheduled pt appt.

## 2022-04-22 ENCOUNTER — Ambulatory Visit: Payer: PRIVATE HEALTH INSURANCE | Admitting: Neurology

## 2023-02-25 ENCOUNTER — Ambulatory Visit
Admission: RE | Admit: 2023-02-25 | Discharge: 2023-02-25 | Disposition: A | Discharge status: Home / Self Care | Payer: BC Managed Care – PPO | Source: Ambulatory Visit | Attending: Family | Admitting: Family

## 2023-02-25 ENCOUNTER — Ambulatory Visit: Payer: BC Managed Care – PPO | Admitting: Family

## 2023-02-25 ENCOUNTER — Ambulatory Visit
Admission: RE | Admit: 2023-02-25 | Discharge: 2023-02-25 | Disposition: A | Discharge status: Home / Self Care | Payer: BC Managed Care – PPO | Attending: Family | Admitting: Family

## 2023-02-25 VITALS — BP 130/70 | HR 77 | Ht 72.0 in | Wt 199.8 lb

## 2023-02-25 DIAGNOSIS — E538 Deficiency of other specified B group vitamins: Secondary | ICD-10-CM

## 2023-02-25 DIAGNOSIS — E782 Mixed hyperlipidemia: Secondary | ICD-10-CM

## 2023-02-25 DIAGNOSIS — R7301 Impaired fasting glucose: Secondary | ICD-10-CM | POA: Diagnosis not present

## 2023-02-25 DIAGNOSIS — M25561 Pain in right knee: Secondary | ICD-10-CM

## 2023-02-25 DIAGNOSIS — R5383 Other fatigue: Secondary | ICD-10-CM

## 2023-02-25 DIAGNOSIS — I10 Essential (primary) hypertension: Secondary | ICD-10-CM

## 2023-02-25 DIAGNOSIS — E559 Vitamin D deficiency, unspecified: Secondary | ICD-10-CM

## 2023-02-25 NOTE — Progress Notes (Signed)
Established Patient Office Visit  Subjective:  Patient ID: Nathan Evans, male    DOB: 1961-05-15  Age: 62 y.o. MRN: [id number]  Chief Complaint  Patient presents with   Follow-up    F/U    Patient is here today for his 3 months follow up.  He has been feeling fairly well since last appointment.   He does have additional concerns to discuss today.   Knee Pain  The incident occurred more than 1 week ago. [place] The injury mechanism is unknown. The pain is present in the right knee. Quality: throbbing. The pain is moderate. The pain has been Fluctuating since onset. Associated symptoms include an inability to bear weight and a loss of motion. He reports no foreign bodies present. The symptoms are aggravated by movement. He has tried NSAIDs for the symptoms. The treatment provided mild relief.   Labs are due today. He needs refills.   I have reviewed his active problem list, medication list, allergies, notes from last encounter, lab results for his appointment today.   No other concerns at this time.   Past Medical History:  Diagnosis Date   Hyperlipidemia    Hypertension     No past surgical history on file.  Social History   Socioeconomic History   Marital status: Married    Spouse name: Not on file   Number of children: Not on file   Years of education: Not on file   Highest education level: Not on file  Occupational History   Not on file  Tobacco Use   Smoking status: Never   Smokeless tobacco: Never  Substance and Sexual Activity   Alcohol use: Not on file   Drug use: Not on file   Sexual activity: Not on file  Other Topics Concern   Not on file  Social History Narrative   Not on file   Social Determinants of Health   Financial Resource Strain: Not on file  Food Insecurity: Not on file  Transportation Needs: Not on file  Physical Activity: Not on file  Stress: Not on file  Social Connections: Not on file  Intimate  Partner Violence: Not on file    No family history on file.  No Known Allergies  Review of Systems  Musculoskeletal:  Positive for joint pain.  All other systems reviewed and are negative.      Objective:   BP 130/70   Pulse 77   Ht 6' (1.829 m)   Wt 199 lb 12.8 oz (90.6 kg)   SpO2 98%   BMI 27.10 kg/m   Vitals:   02/25/23 1459  BP: 130/70  Pulse: 77  Height: 6' (1.829 m)  Weight: 199 lb 12.8 oz (90.6 kg)  SpO2: 98%  BMI (Calculated): 27.09    Physical Exam Vitals and nursing note reviewed.  Constitutional:      Appearance: Normal appearance. He is normal weight.  Eyes:     Extraocular Movements: Extraocular movements intact.     Conjunctiva/sclera: Conjunctivae normal.     Pupils: Pupils are equal, round, and reactive to light.  Cardiovascular:     Rate and Rhythm: Normal rate and regular rhythm.     Pulses: Normal pulses.     Heart sounds: Normal heart sounds.  Pulmonary:     Effort: Pulmonary effort is normal.     Breath sounds: Normal breath sounds.  Musculoskeletal:        General: Normal range of motion.  Right knee: Swelling present. Tenderness present.  Neurological:     General: No focal deficit present.     Mental Status: He is alert and oriented to person, place, and time. Mental status is at baseline.  Psychiatric:        Mood and Affect: Mood normal.        Behavior: Behavior normal.        Thought Content: Thought content normal.        Judgment: Judgment normal.      No results found for any visits on 02/25/23.  No results found for this or any previous visit (from the past 2160 hour(s)).     Assessment & Plan:   Problem List Items Addressed This Visit       Active Problems   Hypertension    Blood pressure well controlled with current medications.  Continue current therapy.  Will reassess at follow up.       Hyperlipidemia    Checking labs today.  Continue current therapy for lipid control. Will modify as needed  based on labwork results.       Relevant Orders   Lipid panel   CBC With Differential   CMP14+EGFR   Impaired fasting glucose    A1C Continues to be in prediabetic ranges.  Will reassess at follow up after next lab check.  Patient counseled on dietary choices and verbalized understanding.  Patient educated on foods that contain carbohydrates and the need to decrease intake.  We discussed prediabetes, and what it means and the need for strict dietary control to prevent progression to type 2 diabetes.  Advised to decrease intake of sugary drinks, including sodas, sweet tea, and some juices, and of starch and sugar heavy foods (ie., potatoes, rice, bread, pasta, desserts). He verbalizes understanding and agreement with the changes discussed today.       Relevant Orders   CBC With Differential   CMP14+EGFR   Hemoglobin A1c   Other Visit Diagnoses     Acute pain of right knee    -  Primary   Will get x-ray of right knee.  Continue current medications.  Will call with results.   Relevant Orders   DG Knee Complete 4 Views Right (Completed)   CBC With Differential   CMP14+EGFR   B12 deficiency due to diet       Checking labs today.  Will continue supplements as needed.   Relevant Orders   CBC With Differential   CMP14+EGFR   Vitamin B12   Vitamin D deficiency, unspecified       Checking labs today.  Will continue supplements as needed.   Relevant Orders   VITAMIN D 25 Hydroxy (Vit-D Deficiency, Fractures)   CBC With Differential   CMP14+EGFR   Other fatigue       Relevant Orders   CBC With Differential   CMP14+EGFR   TSH       Return in about 6 weeks (around 04/08/2023) for F/U.   Total time spent: 30 minutes  Miki Kins, FNP  02/25/2023   This document may have been prepared by [employer] Voice Recognition software and as such may include unintentional dictation errors.

## 2023-03-05 ENCOUNTER — Other Ambulatory Visit: Payer: BC Managed Care – PPO

## 2023-03-05 ENCOUNTER — Encounter: Payer: Self-pay | Admitting: Family

## 2023-03-05 NOTE — Assessment & Plan Note (Signed)
Checking labs today.  Continue current therapy for lipid control. Will modify as needed based on labwork results.  

## 2023-03-05 NOTE — Assessment & Plan Note (Signed)
A1C Continues to be in prediabetic ranges.  Will reassess at follow up after next lab check.  Patient counseled on dietary choices and verbalized understanding.  Patient educated on foods that contain carbohydrates and the need to decrease intake.  We discussed prediabetes, and what it means and the need for strict dietary control to prevent progression to type 2 diabetes.  Advised to decrease intake of sugary drinks, including sodas, sweet tea, and some juices, and of starch and sugar heavy foods (ie., potatoes, rice, bread, pasta, desserts). He verbalizes understanding and agreement with the changes discussed today.  

## 2023-03-05 NOTE — Assessment & Plan Note (Signed)
Blood pressure well controlled with current medications.  Continue current therapy.  Will reassess at follow up.  

## 2023-03-06 LAB — LIPID PANEL
Chol/HDL Ratio: 2.7 ratio (ref 0.0–5.0)
Cholesterol, Total: 116 mg/dL (ref 100–199)
HDL: 43 mg/dL (ref >39)
LDL Chol Calc (NIH): 59 mg/dL (ref 0–99)
Triglycerides: 66 mg/dL (ref 0–149)
VLDL Cholesterol Cal: 14 mg/dL (ref 5–40)

## 2023-03-06 LAB — VITAMIN B12: Vitamin B-12: 469 pg/mL (ref 232–1245)

## 2023-03-06 LAB — CMP14+EGFR
ALT: 27 IU/L (ref 0–44)
AST: 23 IU/L (ref 0–40)
Albumin: 4.1 g/dL (ref 3.9–4.9)
Alkaline Phosphatase: 114 IU/L (ref 44–121)
BUN/Creatinine Ratio: 10 (ref 10–24)
BUN: 12 mg/dL (ref 8–27)
Bilirubin Total: 0.7 mg/dL (ref 0.0–1.2)
CO2: 25 mmol/L (ref 20–29)
Calcium: 9.5 mg/dL (ref 8.6–10.2)
Chloride: 102 mmol/L (ref 96–106)
Creatinine, Ser: 1.18 mg/dL (ref 0.76–1.27)
Globulin, Total: 3.2 g/dL (ref 1.5–4.5)
Glucose: 91 mg/dL (ref 70–99)
Potassium: 5.2 mmol/L (ref 3.5–5.2)
Sodium: 140 mmol/L (ref 134–144)
Total Protein: 7.3 g/dL (ref 6.0–8.5)
eGFR: 70 mL/min/{1.73_m2} (ref >59)

## 2023-03-06 LAB — CBC WITH DIFFERENTIAL
Basophils Absolute: 0 10*3/uL (ref 0.0–0.2)
Basos: 1 %
EOS (ABSOLUTE): 0.1 10*3/uL (ref 0.0–0.4)
Eos: 4 %
Hematocrit: 48.1 % (ref 37.5–51.0)
Hemoglobin: 15.4 g/dL (ref 13.0–17.7)
Immature Grans (Abs): 0 10*3/uL (ref 0.0–0.1)
Immature Granulocytes: 0 %
Lymphocytes Absolute: 1.8 10*3/uL (ref 0.7–3.1)
Lymphs: 44 %
MCH: 28.4 pg (ref 26.6–33.0)
MCHC: 32 g/dL (ref 31.5–35.7)
MCV: 89 fL (ref 79–97)
Monocytes Absolute: 0.3 10*3/uL (ref 0.1–0.9)
Monocytes: 7 %
Neutrophils Absolute: 1.8 10*3/uL (ref 1.4–7.0)
Neutrophils: 44 %
RBC: 5.42 x10E6/uL (ref 4.14–5.80)
RDW: 11.9 % (ref 11.6–15.4)
WBC: 4 10*3/uL (ref 3.4–10.8)

## 2023-03-06 LAB — HEMOGLOBIN A1C
Est. average glucose Bld gHb Est-mCnc: 131 mg/dL
Hgb A1c MFr Bld: 6.2 % — ABNORMAL HIGH (ref 4.8–5.6)

## 2023-03-06 LAB — VITAMIN D 25 HYDROXY (VIT D DEFICIENCY, FRACTURES): Vit D, 25-Hydroxy: 33.2 ng/mL (ref 30.0–100.0)

## 2023-03-06 LAB — TSH: TSH: 0.85 u[IU]/mL (ref 0.450–4.500)

## 2023-03-25 ENCOUNTER — Other Ambulatory Visit: Payer: Self-pay | Admitting: Family

## 2023-03-25 DIAGNOSIS — I10 Essential (primary) hypertension: Secondary | ICD-10-CM

## 2023-03-26 IMAGING — MR MR HEAD W/O CM
12 series · 43 of 48 positions shown · non-contrast
Comparison: No pertinent prior exam.
COMPARISON: Report from brain MRI 01/21/2000 (images unavailable).

CLINICAL DATA: Dizziness, nonspecific. Additional history provided:
Patient reports nausea/vomiting with dizziness, diaphoresis since
last night.

EXAM:
MRI HEAD WITHOUT CONTRAST
MRA HEAD WITHOUT CONTRAST
TECHNIQUE: Multiplanar, multi-echo pulse sequences of the brain and surrounding
structures were acquired without intravenous contrast. Angiographic
images of the Circle of Willis were acquired using MRA technique
without intravenous contrast.

[Series 5: ax dwi_tracew · axial · 3.0mm · 0.65mm/px · z∈[-64,+91]mm · 4 of 48 slices shown]
[im 1/48]
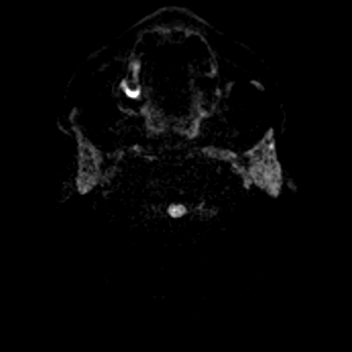
[im 16/48]
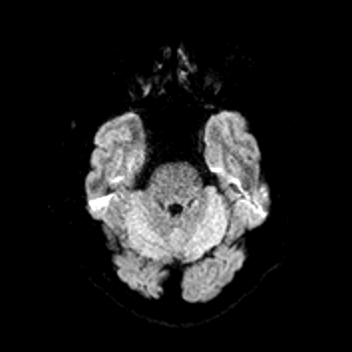
[im 32/48]
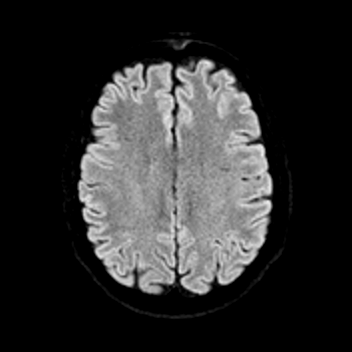
[im 48/48]
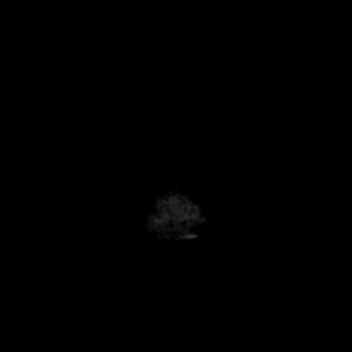

[Series 6: ax dwi_adc · axial · 3.0mm · 0.65mm/px · z∈[-64,+91]mm · 4 of 48 slices shown]
[im 1/48]
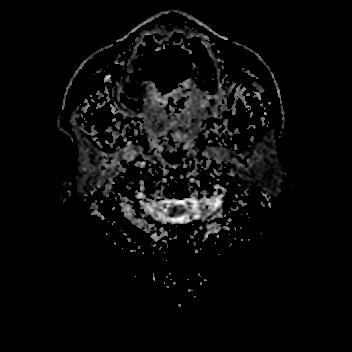
[im 16/48]
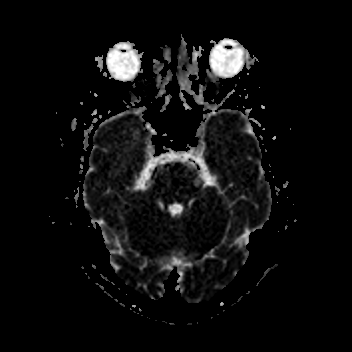
[im 32/48]
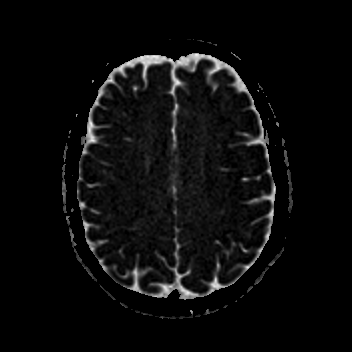
[im 48/48]
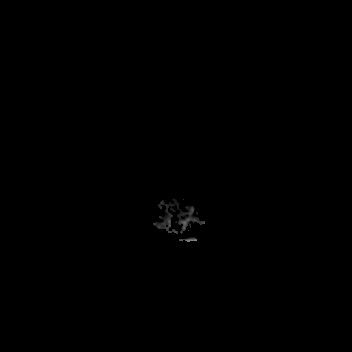

[Series 7: cor dwi_tracew · coronal · 5.0mm · 0.60mm/px · 3 of 38 slices shown]
[im 1/38]
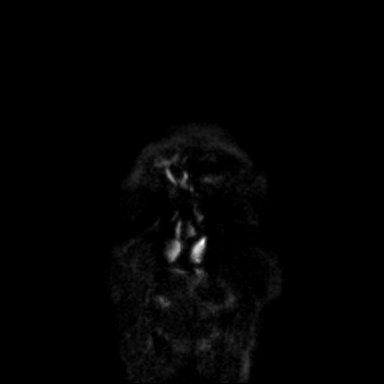
[im 19/38]
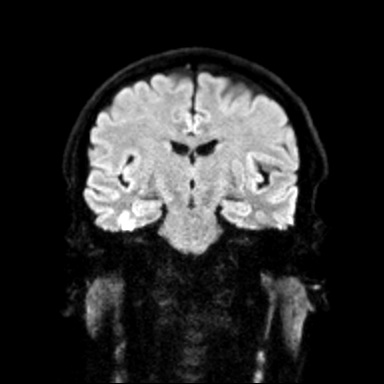
[im 38/38]
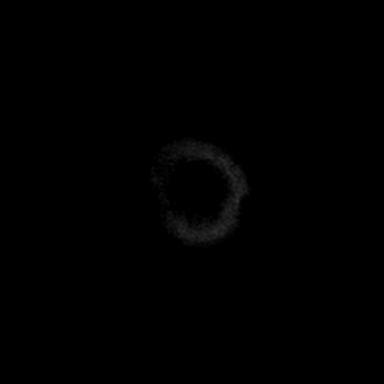

[Series 8: cor dwi_adc · coronal · 5.0mm · 0.60mm/px · 3 of 38 slices shown]
[im 1/38]
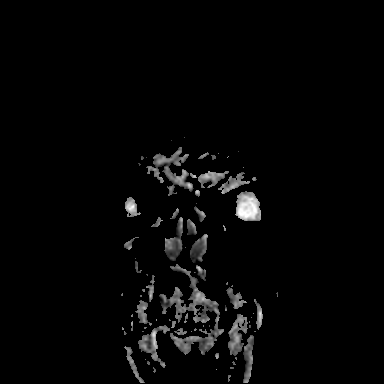
[im 19/38]
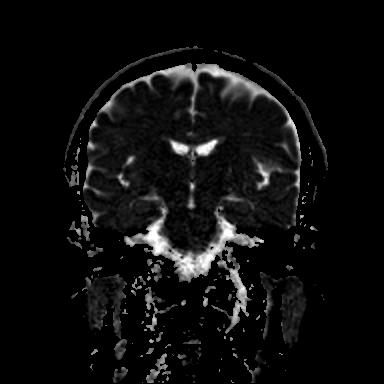
[im 38/38]
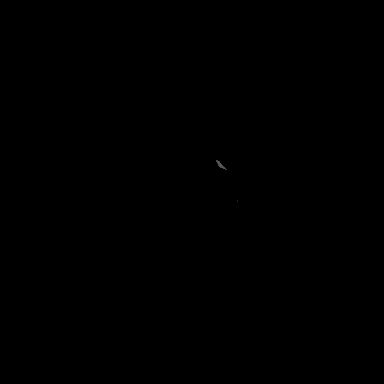

[Series 9: T1 · sagittal · 5.0mm · 0.62mm/px · 2 of 23 slices shown (1 of 2)]
[im 1/23]
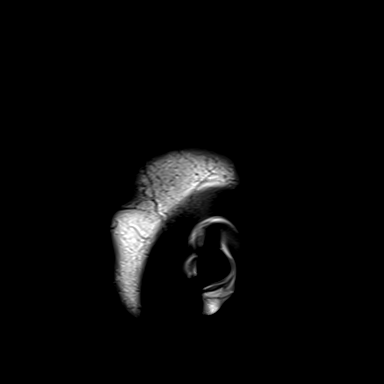
[im 23/23]
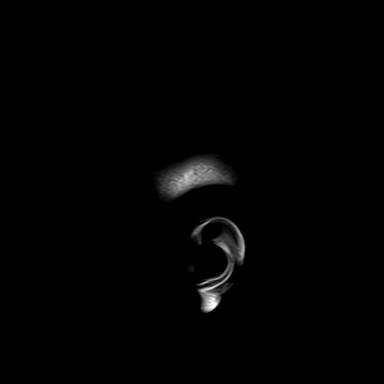

[Series 10: T2 · axial · 5.0mm · 0.53mm/px · z∈[-61,+88]mm · 2 of 26 slices shown (1 of 2)]
[im 1/26]
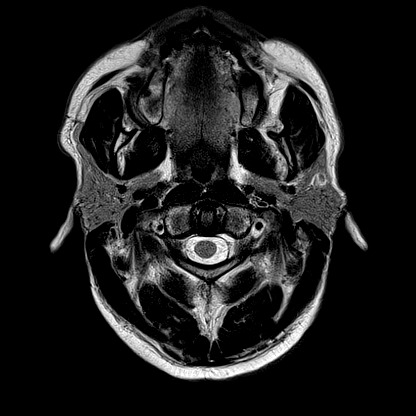
[im 26/26]
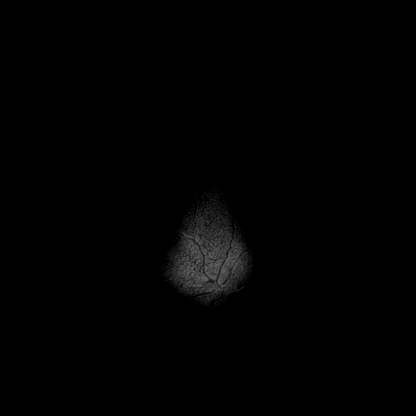

[Series 11: mag_images · axial · 3.0mm · 0.90mm/px · z∈[-74,+102]mm · 4 of 60 slices shown]
[im 1/60]
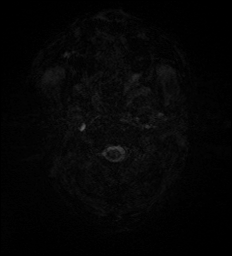
[im 20/60]
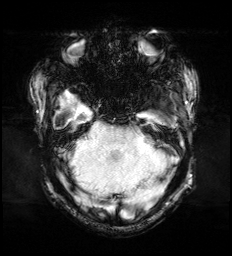
[im 40/60]
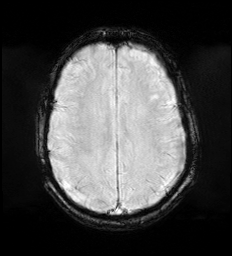
[im 60/60]
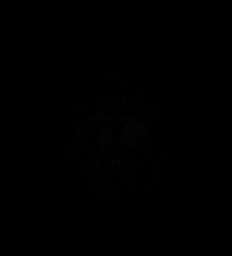

[Series 12: pha_images · axial · 3.0mm · 0.90mm/px · z∈[-74,+102]mm · 4 of 60 slices shown]
[im 1/60]
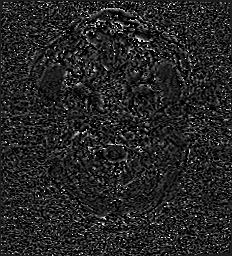
[im 20/60]
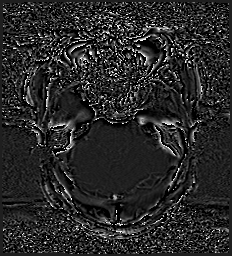
[im 40/60]
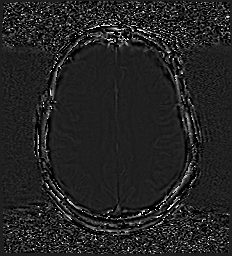
[im 60/60]
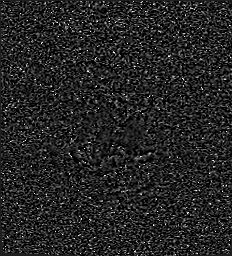

[Series 13: swi_images · axial · 3.0mm · 0.90mm/px · z∈[-74,+42]mm · 3 of 60 slices shown]
[im 1/60]
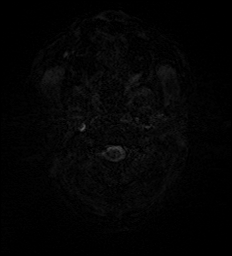
[im 20/60]
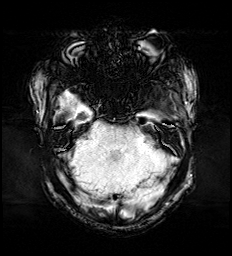
[im 40/60]
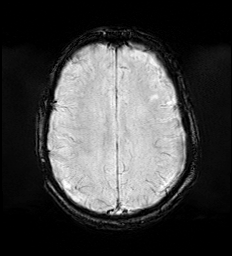

[Series 15: FLAIR · axial · 3.0mm · 0.53mm/px · z∈[-66,+95]mm · 4 of 55 slices shown]
[im 1/55]
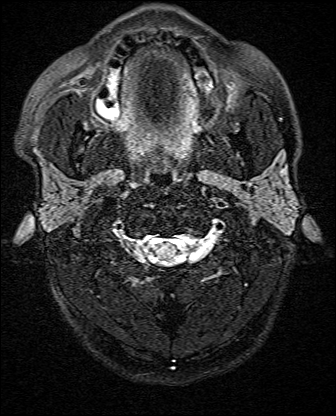
[im 19/55]
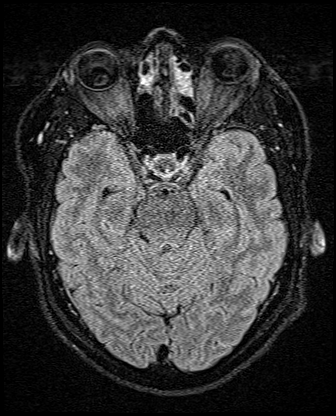
[im 37/55]
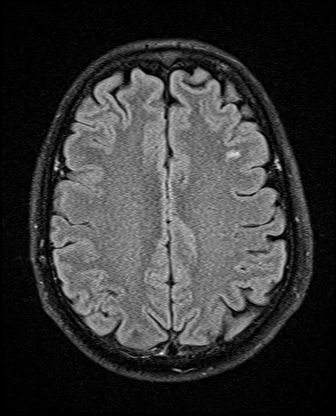
[im 55/55]
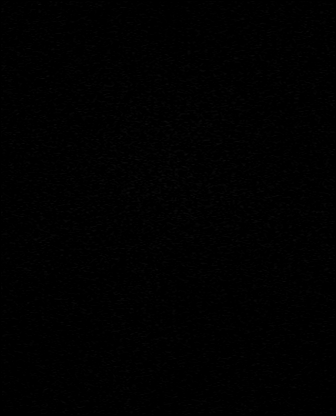

[Series 16: T1 · axial · 1.0mm · 0.98mm/px · z∈[-56,+102]mm · 8 of 156 slices shown (2 of 2)]
[im 1/156]
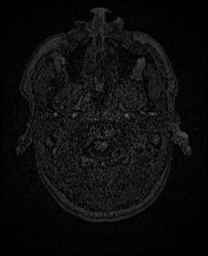
[im 29/156]
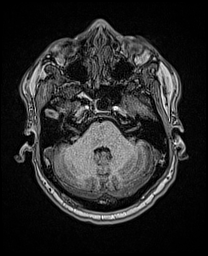
[im 43/156]
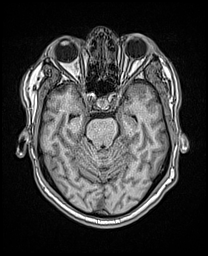
[im 71/156]
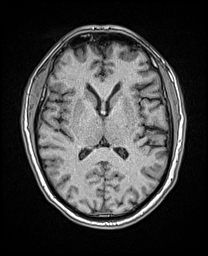
[im 85/156]
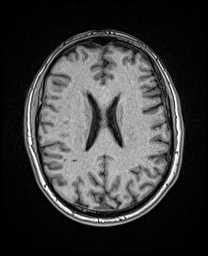
[im 113/156]
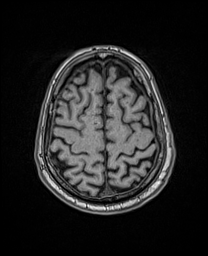
[im 127/156]
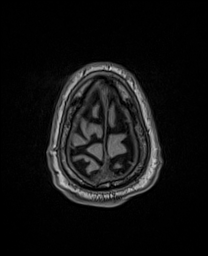
[im 156/156]
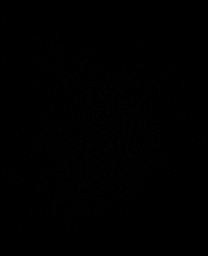

[Series 17: T2 · coronal · 5.0mm · 0.57mm/px · 2 of 29 slices shown (2 of 2)]
[im 1/29]
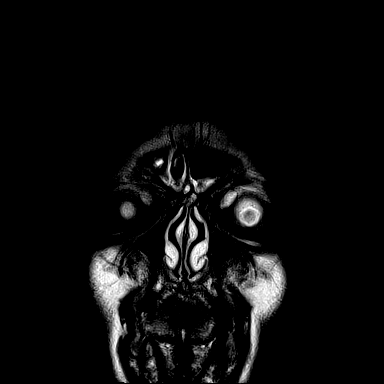
[im 29/29]
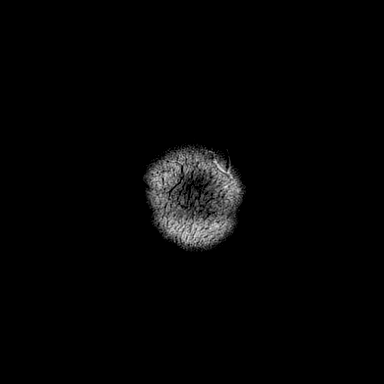

[43 of 48 positions shown; findings below may reference images not displayed]

FINDINGS: MRI HEAD FINDINGS

Brain:

Cerebral volume is normal for age.

There are small acute infarcts within the inferomedial right
cerebellum measuring up to 7 mm (PICA territory) (series 5, image
11) (series 5, images 9-10).

Mild multifocal T2/FLAIR hyperintensity within the cerebral white
matter, nonspecific but compatible with chronic small vessel
ischemic disease.

No evidence of intracranial mass.

No chronic intracranial blood products.

No extra-axial fluid collection.

No midline shift.

Vascular: Expected proximal arterial flow voids.

Skull and upper cervical spine: No focal marrow lesion.

Sinuses/Orbits: Visualized orbits show no acute finding. Mild
mucosal thickening within the right frontal sinus. Moderate mucosal
thickening within the left frontal sinus. Moderate bilateral ethmoid
sinus mucosal thickening. Tiny left maxillary sinus mucous retention
cyst.

MRA HEAD FINDINGS

Anterior circulation:

The intracranial internal carotid arteries are patent. The M1 middle
cerebral arteries are patent. No M2 proximal branch occlusion or
high-grade proximal stenosis is identified. The A1 right anterior
cerebral artery is developmentally absent or markedly hypoplastic.
The anterior cerebral arteries are otherwise patent. 1-2 mm
posteriorly projecting vascular protrusion arising from the
paraclinoid right ICA, which may reflect an aneurysm or infundibulum
(series 1, image 109).

Posterior circulation:

The intracranial vertebral arteries are patent. The left vertebral
artery is slightly dominant. The basilar artery is patent. Flow
related signal is present within the proximal right PICA. However,
there is an apparent severe stenosis within the proximal right PICA
(series 0350, image 15). The posterior cerebral arteries are patent.
Posterior communicating arteries are hypoplastic or absent
bilaterally.

Anatomic variants: As described
IMPRESSION: MRI brain:

1. Small acute infarcts within the inferomedial right cerebellum,
measuring up to 7 mm (PICA territory).
2. Mild cerebral white matter chronic small vessel ischemic disease.
3. Paranasal sinus disease, as described.

MRA head:

1. No intracranial large vessel occlusion.
2. Apparent severe stenosis within the proximal right PICA.
3. 1-2 mm posteriorly projecting vascular protrusion arising from
the paraclinoid right ICA, which may reflect an aneurysm or
infundibulum.

## 2023-03-31 ENCOUNTER — Other Ambulatory Visit: Payer: Self-pay | Admitting: Family

## 2023-03-31 DIAGNOSIS — E782 Mixed hyperlipidemia: Secondary | ICD-10-CM

## 2023-04-02 ENCOUNTER — Telehealth: Payer: Self-pay | Admitting: Family

## 2023-04-02 NOTE — Telephone Encounter (Signed)
Patient called wanting x-ray results. Advised him Marchelle Folks said that they will discuss the results at his follow up. Patient verbalized understanding.

## 2023-04-08 ENCOUNTER — Encounter: Payer: Self-pay | Admitting: Family

## 2023-04-08 ENCOUNTER — Ambulatory Visit: Payer: BC Managed Care – PPO | Admitting: Family

## 2023-04-08 VITALS — BP 130/70 | HR 79 | Ht 72.0 in | Wt 202.6 lb

## 2023-04-08 DIAGNOSIS — R7303 Prediabetes: Secondary | ICD-10-CM | POA: Insufficient documentation

## 2023-04-08 DIAGNOSIS — G8929 Other chronic pain: Secondary | ICD-10-CM | POA: Diagnosis not present

## 2023-04-08 DIAGNOSIS — M25561 Pain in right knee: Secondary | ICD-10-CM

## 2023-04-08 DIAGNOSIS — E782 Mixed hyperlipidemia: Secondary | ICD-10-CM

## 2023-04-08 NOTE — Progress Notes (Signed)
Established Patient Office Visit  Subjective:  Patient ID: Nathan Evans, male    DOB: 02/17/1961  Age: 62 y.o. MRN: [id number]  Chief Complaint  Patient presents with   Follow-up    6 week f/u    Pt. Here for 6 week f/u. Knee is still significantly painful, says that he notices the pain more when he is off of it after working or walking.   He has been doing the exercises we discussed at his previous appointment, and using his NSAIDS, but he has not seen any improvement with his knee at all.   Labwork was done at last appointment, A1C still elevated, but the rest of the labs look good.   No other concerns at this time.   Past Medical History:  Diagnosis Date   Hyperlipidemia    Hypertension     History reviewed. No pertinent surgical history.  Social History   Socioeconomic History   Marital status: Married    Spouse name: Not on file   Number of children: Not on file   Years of education: Not on file   Highest education level: Not on file  Occupational History   Not on file  Tobacco Use   Smoking status: Never   Smokeless tobacco: Never  Substance and Sexual Activity   Alcohol use: Not on file   Drug use: Not on file   Sexual activity: Not on file  Other Topics Concern   Not on file  Social History Narrative   Not on file   Social Determinants of Health   Financial Resource Strain: Not on file  Food Insecurity: Not on file  Transportation Needs: Not on file  Physical Activity: Not on file  Stress: Not on file  Social Connections: Not on file  Intimate Partner Violence: Not on file    History reviewed. No pertinent family history.  No Known Allergies  Review of Systems  Musculoskeletal:  Positive for joint pain.  All other systems reviewed and are negative.      Objective:   BP 130/70   Pulse 79   Ht 6' (1.829 m)   Wt 202 lb 9.6 oz (91.9 kg)   SpO2 97%   BMI 27.48 kg/m   Vitals:   04/08/23 1409  BP: 130/70  Pulse: 79   Height: 6' (1.829 m)  Weight: 202 lb 9.6 oz (91.9 kg)  SpO2: 97%  BMI (Calculated): 27.47    Physical Exam Vitals and nursing note reviewed.  Constitutional:      Appearance: Normal appearance. He is normal weight.  Eyes:     Pupils: Pupils are equal, round, and reactive to light.  Cardiovascular:     Rate and Rhythm: Normal rate and regular rhythm.     Pulses: Normal pulses.     Heart sounds: Normal heart sounds.  Pulmonary:     Effort: Pulmonary effort is normal.     Breath sounds: Normal breath sounds.  Musculoskeletal:        General: Tenderness present.     Right knee: Swelling and crepitus present. Tenderness present over the medial joint line and lateral joint line. Normal alignment and normal patellar mobility.  Neurological:     Mental Status: He is alert.  Psychiatric:        Mood and Affect: Mood normal.        Behavior: Behavior normal.        Thought Content: Thought content normal.      No results found  for any visits on 04/08/23.  Recent Results (from the past 2160 hour(s))  Lipid panel     Status: None   Collection Time: 03/05/23  8:48 AM  Result Value Ref Range   Cholesterol, Total 116 100 - 199 mg/dL   Triglycerides 66 0 - 149 mg/dL   HDL 43 >16 mg/dL   VLDL Cholesterol Cal 14 5 - 40 mg/dL   LDL Chol Calc (NIH) 59 0 - 99 mg/dL   Chol/HDL Ratio 2.7 0.0 - 5.0 ratio    Comment:                                   T. Chol/HDL Ratio                                             Men  Women                               1/2 Avg.Risk  3.4    3.3                                   Avg.Risk  5.0    4.4                                2X Avg.Risk  9.6    7.1                                3X Avg.Risk 23.4   11.0   VITAMIN D 25 Hydroxy (Vit-D Deficiency, Fractures)     Status: None   Collection Time: 03/05/23  8:48 AM  Result Value Ref Range   Vit D, 25-Hydroxy 33.2 30.0 - 100.0 ng/mL    Comment: Vitamin D deficiency has been defined by the Institute  of Medicine and an Endocrine Society practice guideline as a level of serum 25-OH vitamin D less than 20 ng/mL (1,2). The Endocrine Society went on to further define vitamin D insufficiency as a level between 21 and 29 ng/mL (2). 1. IOM (Institute of Medicine). 2010. Dietary reference    intakes for calcium and D. [place]: The    [employer]. 2. Holick MF, Binkley Ursa, Bischoff-Ferrari HA, et al.    Evaluation, treatment, and prevention of vitamin D    deficiency: an Endocrine Society clinical practice    guideline. JCEM. 2011 Jul; 96(7):1911-30.   CBC With Differential     Status: None   Collection Time: 03/05/23  8:48 AM  Result Value Ref Range   WBC 4.0 3.4 - 10.8 x10E3/uL   RBC 5.42 4.14 - 5.80 x10E6/uL   Hemoglobin 15.4 13.0 - 17.7 g/dL   Hematocrit 10.9 60.4 - 51.0 %   MCV 89 79 - 97 fL   MCH 28.4 26.6 - 33.0 pg   MCHC 32.0 31.5 - 35.7 g/dL   RDW 54.0 98.1 - 19.1 %   Neutrophils 44 Not Estab. %   Lymphs 44 Not Estab. %   Monocytes 7 Not Estab. %   Eos 4 Not Estab. %  Basos 1 Not Estab. %   Neutrophils Absolute 1.8 1.4 - 7.0 x10E3/uL   Lymphocytes Absolute 1.8 0.7 - 3.1 x10E3/uL   Monocytes Absolute 0.3 0.1 - 0.9 x10E3/uL   EOS (ABSOLUTE) 0.1 0.0 - 0.4 x10E3/uL   Basophils Absolute 0.0 0.0 - 0.2 x10E3/uL   Immature Granulocytes 0 Not Estab. %   Immature Grans (Abs) 0.0 0.0 - 0.1 x10E3/uL    Comment: **Effective March 29, 2023, profile [id number] CBC/Differential**   (No Platelet) will be made non-orderable. [facility] Offers:   [id number] CBC With Differential/Platelet   CMP14+EGFR     Status: None   Collection Time: 03/05/23  8:48 AM  Result Value Ref Range   Glucose 91 70 - 99 mg/dL   BUN 12 8 - 27 mg/dL   Creatinine, Ser 3.08 0.76 - 1.27 mg/dL   eGFR 70 >65 HQ/ION/6.29   BUN/Creatinine Ratio 10 10 - 24   Sodium 140 134 - 144 mmol/L   Potassium 5.2 3.5 - 5.2 mmol/L   Chloride 102 96 - 106 mmol/L   CO2 25 20 - 29 mmol/L   Calcium 9.5 8.6 - 10.2 mg/dL    Total Protein 7.3 6.0 - 8.5 g/dL   Albumin 4.1 3.9 - 4.9 g/dL   Globulin, Total 3.2 1.5 - 4.5 g/dL   Bilirubin Total 0.7 0.0 - 1.2 mg/dL   Alkaline Phosphatase 114 44 - 121 IU/L   AST 23 0 - 40 IU/L   ALT 27 0 - 44 IU/L  TSH     Status: None   Collection Time: 03/05/23  8:48 AM  Result Value Ref Range   TSH 0.850 0.450 - 4.500 uIU/mL  Hemoglobin A1c     Status: Abnormal   Collection Time: 03/05/23  8:48 AM  Result Value Ref Range   Hgb A1c MFr Bld 6.2 (H) 4.8 - 5.6 %    Comment:          Prediabetes: 5.7 - 6.4          Diabetes: >6.4          Glycemic control for adults with diabetes: <7.0    Est. average glucose Bld gHb Est-mCnc 131 mg/dL  Vitamin B28     Status: None   Collection Time: 03/05/23  8:48 AM  Result Value Ref Range   Vitamin B-12 469 232 - 1,245 pg/mL       Assessment & Plan:   Problem List Items Addressed This Visit       Active Problems   Hyperlipidemia   Prediabetes    Patient educated on foods that contain carbohydrates and the need to decrease intake.  We discussed prediabetes, and what it means and the need for strict dietary control to prevent progression to type 2 diabetes.  Advised to decrease intake of sugary drinks, including sodas, sweet tea, and some juices, and of starch and sugar heavy foods (ie., potatoes, rice, bread, pasta, desserts). He verbalizes understanding and agreement with the changes discussed today.  A1C Continues to be in prediabetic ranges.  Will reassess at follow up after next lab check.  Patient counseled on dietary choices and verbalized understanding.       Other Visit Diagnoses     Chronic pain of right knee    -  Primary   He has tried the exercises and the NSAIDs for the last 6 weeks and they have not been effective at resolving his pain.  Ordering MRI today.   Relevant Orders   MR  Knee Right Wo Contrast       Return in about 3 months (around 07/09/2023).   Total time spent: 20 minutes  Miki Kins,  FNP  04/08/2023   This document may have been prepared by [facility] Voice Recognition software and as such may include unintentional dictation errors.

## 2023-04-08 NOTE — Assessment & Plan Note (Deleted)
Patient educated on foods that contain carbohydrates and the need to decrease intake.  We discussed prediabetes, and what it means and the need for strict dietary control to prevent progression to type 2 diabetes.  Advised to decrease intake of sugary drinks, including sodas, sweet tea, and some juices, and of starch and sugar heavy foods (ie., potatoes, rice, bread, pasta, desserts). He verbalizes understanding and agreement with the changes discussed today.   A1C Continues to be in prediabetic ranges.  Will reassess at follow up after next lab check.  Patient counseled on dietary choices and verbalized understanding.   

## 2023-04-08 NOTE — Assessment & Plan Note (Signed)
Patient educated on foods that contain carbohydrates and the need to decrease intake.  We discussed prediabetes, and what it means and the need for strict dietary control to prevent progression to type 2 diabetes.  Advised to decrease intake of sugary drinks, including sodas, sweet tea, and some juices, and of starch and sugar heavy foods (ie., potatoes, rice, bread, pasta, desserts). He verbalizes understanding and agreement with the changes discussed today.   A1C Continues to be in prediabetic ranges.  Will reassess at follow up after next lab check.  Patient counseled on dietary choices and verbalized understanding.   

## 2023-10-07 ENCOUNTER — Ambulatory Visit: Payer: BC Managed Care – PPO | Admitting: Family

## 2023-10-07 ENCOUNTER — Other Ambulatory Visit: Payer: Self-pay

## 2023-10-07 ENCOUNTER — Encounter: Payer: Self-pay | Admitting: Family

## 2023-10-07 VITALS — BP 138/78 | HR 86 | Ht 72.0 in | Wt 197.8 lb

## 2023-10-07 DIAGNOSIS — Z23 Encounter for immunization: Secondary | ICD-10-CM | POA: Diagnosis not present

## 2023-10-07 DIAGNOSIS — M25532 Pain in left wrist: Secondary | ICD-10-CM

## 2023-10-07 DIAGNOSIS — I10 Essential (primary) hypertension: Secondary | ICD-10-CM

## 2023-10-07 DIAGNOSIS — E782 Mixed hyperlipidemia: Secondary | ICD-10-CM

## 2023-10-07 DIAGNOSIS — R7303 Prediabetes: Secondary | ICD-10-CM

## 2023-10-07 DIAGNOSIS — E559 Vitamin D deficiency, unspecified: Secondary | ICD-10-CM

## 2023-10-07 DIAGNOSIS — R002 Palpitations: Secondary | ICD-10-CM

## 2023-10-07 DIAGNOSIS — E538 Deficiency of other specified B group vitamins: Secondary | ICD-10-CM

## 2023-10-07 MED ORDER — SILDENAFIL CITRATE 50 MG PO TABS: 50.0000 mg | ORAL_TABLET | ORAL | 1 refills | Status: DC | PRN

## 2023-10-07 NOTE — Assessment & Plan Note (Signed)
 Blood pressure well controlled with current medications.  Continue current therapy.  Will reassess at follow up.

## 2023-10-07 NOTE — Assessment & Plan Note (Signed)
 Checking labs today.  Continue current therapy for lipid control. Will modify as needed based on labwork results.

## 2023-10-07 NOTE — Assessment & Plan Note (Signed)
 A1C Continues to be in prediabetic ranges.  Will reassess at follow up after next lab check.  Patient counseled on dietary choices and verbalized understanding.  Patient educated on foods that contain carbohydrates and the need to decrease intake.  We discussed prediabetes, and what it means and the need for strict dietary control to prevent progression to type 2 diabetes.  Advised to decrease intake of sugary drinks, including sodas, sweet tea, and some juices, and of starch and sugar heavy foods (ie., potatoes, rice, bread, pasta, desserts). He verbalizes understanding and agreement with the changes discussed today.

## 2023-10-07 NOTE — Progress Notes (Signed)
 Established Patient Office Visit  Subjective:  Patient ID: Nathan Evans, male    DOB: [birth year]  Age: 63 y.o. MRN: [id number]  Chief Complaint  Patient presents with   Follow-up    6 month follow up    Patient is here today for his 6 months follow up.  He has been feeling fairly well since last appointment.   He does have additional concerns to discuss today.     Wrist Pain  The pain is present in the left wrist. This is a new problem. The current episode started more than 1 month ago. There has been no history of extremity trauma. The problem occurs constantly. The problem has been waxing and waning. The quality of the pain is described as aching, burning and sharp. The pain is at a severity of 10/10. The pain is severe. Associated symptoms include an inability to bear weight. The symptoms are aggravated by cold and activity. He has tried acetaminophen , NSAIDS, heat, cold, movement and rest for the symptoms. The treatment provided no relief.    He is also having some trouble with ED, so he's asking if we can send some Viagra  or something similar to help with this for him.   Labs are due today. He needs refills.   I have reviewed his active problem list, medication list, allergies, notes from last encounter, lab results for his appointment today.   No other concerns at this time.   Past Medical History:  Diagnosis Date   Hyperlipidemia    Hypertension     History reviewed. No pertinent surgical history.  Social History   Socioeconomic History   Marital status: Married    Spouse name: Not on file   Number of children: Not on file   Years of education: Not on file   Highest education level: Not on file  Occupational History   Not on file  Tobacco Use   Smoking status: Never   Smokeless tobacco: Never  Substance and Sexual Activity   Alcohol use: Not on file   Drug use: Not on file   Sexual activity: Not on file  Other Topics Concern   Not on file   Social History Narrative   Not on file   Social Drivers of Health   Financial Resource Strain: Not on file  Food Insecurity: Not on file  Transportation Needs: Not on file  Physical Activity: Not on file  Stress: Not on file  Social Connections: Not on file  Intimate Partner Violence: Not on file    History reviewed. No pertinent family history.  No Known Allergies  Review of Systems  Musculoskeletal:  Positive for joint pain.  All other systems reviewed and are negative.      Objective:   BP 138/78   Pulse 86   Ht 6' (1.829 m)   Wt 197 lb 12.8 oz (89.7 kg)   SpO2 97%   BMI 26.83 kg/m   Vitals:   10/07/23 1125  BP: 138/78  Pulse: 86  Height: 6' (1.829 m)  Weight: 197 lb 12.8 oz (89.7 kg)  SpO2: 97%  BMI (Calculated): 26.82    Physical Exam Vitals and nursing note reviewed.  Constitutional:      Appearance: Normal appearance. He is normal weight.  Eyes:     Pupils: Pupils are equal, round, and reactive to light.  Cardiovascular:     Rate and Rhythm: Normal rate and regular rhythm.     Pulses: Normal pulses.  Heart sounds: Normal heart sounds.  Pulmonary:     Effort: Pulmonary effort is normal.     Breath sounds: Normal breath sounds.  Musculoskeletal:        General: Tenderness present.  Neurological:     General: No focal deficit present.     Mental Status: He is alert and oriented to person, place, and time. Mental status is at baseline.  Psychiatric:        Mood and Affect: Mood normal.        Behavior: Behavior normal.        Thought Content: Thought content normal.        Judgment: Judgment normal.      No results found for any visits on 10/07/23.  No results found for this or any previous visit (from the past 2160 hours).     Assessment & Plan:   Problem List Items Addressed This Visit       Cardiovascular and Mediastinum   Hypertension   Blood pressure well controlled with current medications.  Continue current therapy.   Will reassess at follow up.        Relevant Medications   sildenafil  (VIAGRA ) 50 MG tablet   Other Relevant Orders   CBC with Diff   CMP14+EGFR     Other   Hyperlipidemia   Checking labs today.  Continue current therapy for lipid control. Will modify as needed based on labwork results.        Relevant Medications   sildenafil  (VIAGRA ) 50 MG tablet   Other Relevant Orders   CBC with Diff   Lipid panel   CMP14+EGFR   Prediabetes   A1C Continues to be in prediabetic ranges.  Will reassess at follow up after next lab check.  Patient counseled on dietary choices and verbalized understanding.  Patient educated on foods that contain carbohydrates and the need to decrease intake.  We discussed prediabetes, and what it means and the need for strict dietary control to prevent progression to type 2 diabetes.  Advised to decrease intake of sugary drinks, including sodas, sweet tea, and some juices, and of starch and sugar heavy foods (ie., potatoes, rice, bread, pasta, desserts). He verbalizes understanding and agreement with the changes discussed today.        Relevant Orders   CBC with Diff   CMP14+EGFR   Hemoglobin A1c   Other Visit Diagnoses       Flu vaccine need    -  Primary   Flu vaccine given in office today.  Patient encouraged to manage symptoms as needed with supportive measures.   Relevant Orders   Influenza, MDCK, trivalent, PF(Flucelvax egg-free) (Completed)   CBC with Diff   CMP14+EGFR     Palpitations       EKG in office today WNL.   Relevant Orders   EKG 12-Lead   CBC with Diff   CMP14+EGFR     Vitamin D  deficiency, unspecified       Checking labs today.  Will continue supplements as needed.   Relevant Orders   CBC with Diff   VITAMIN D  25 Hydroxy (Vit-D Deficiency, Fractures)   CMP14+EGFR     B12 deficiency due to diet       Checking labs today.  Will continue supplements as needed.   Relevant Orders   CBC with Diff   CMP14+EGFR   Vitamin B12      Left wrist pain       Suggested patient use brace for  his left wrist. He will let me know if this does not improve his pain. If necessary we will send referral to Ortho       Return in about 6 months (around 04/05/2024) for F/U.   Total time spent: 20 minutes  ALAN CHRISTELLA ARRANT, FNP  10/07/2023   This document may have been prepared by [facility] Voice Recognition software and as such may include unintentional dictation errors.

## 2023-10-08 LAB — CMP14+EGFR
ALT: 19 [IU]/L (ref 0–44)
AST: 20 [IU]/L (ref 0–40)
Albumin: 4.2 g/dL (ref 3.9–4.9)
Alkaline Phosphatase: 96 [IU]/L (ref 44–121)
BUN/Creatinine Ratio: 11 (ref 10–24)
BUN: 12 mg/dL (ref 8–27)
Bilirubin Total: 0.9 mg/dL (ref 0.0–1.2)
CO2: 24 mmol/L (ref 20–29)
Calcium: 9.4 mg/dL (ref 8.6–10.2)
Chloride: 102 mmol/L (ref 96–106)
Creatinine, Ser: 1.11 mg/dL (ref 0.76–1.27)
Globulin, Total: 3.1 g/dL (ref 1.5–4.5)
Glucose: 84 mg/dL (ref 70–99)
Potassium: 4.9 mmol/L (ref 3.5–5.2)
Sodium: 139 mmol/L (ref 134–144)
Total Protein: 7.3 g/dL (ref 6.0–8.5)
eGFR: 76 mL/min/{1.73_m2} (ref >59)

## 2023-10-08 LAB — CBC WITH DIFFERENTIAL/PLATELET
Basophils Absolute: 0 10*3/uL (ref 0.0–0.2)
Basos: 1 %
EOS (ABSOLUTE): 0.1 10*3/uL (ref 0.0–0.4)
Eos: 2 %
Hematocrit: 47.2 % (ref 37.5–51.0)
Hemoglobin: 15.8 g/dL (ref 13.0–17.7)
Immature Grans (Abs): 0 10*3/uL (ref 0.0–0.1)
Immature Granulocytes: 0 %
Lymphocytes Absolute: 1.4 10*3/uL (ref 0.7–3.1)
Lymphs: 37 %
MCH: 30 pg (ref 26.6–33.0)
MCHC: 33.5 g/dL (ref 31.5–35.7)
MCV: 90 fL (ref 79–97)
Monocytes Absolute: 0.3 10*3/uL (ref 0.1–0.9)
Monocytes: 7 %
Neutrophils Absolute: 2.1 10*3/uL (ref 1.4–7.0)
Neutrophils: 53 %
Platelets: 187 10*3/uL (ref 150–450)
RBC: 5.27 x10E6/uL (ref 4.14–5.80)
RDW: 12 % (ref 11.6–15.4)
WBC: 3.8 10*3/uL (ref 3.4–10.8)

## 2023-10-08 LAB — LIPID PANEL
Chol/HDL Ratio: 4.2 {ratio} (ref 0.0–5.0)
Cholesterol, Total: 183 mg/dL (ref 100–199)
HDL: 44 mg/dL (ref >39)
LDL Chol Calc (NIH): 127 mg/dL — ABNORMAL HIGH (ref 0–99)
Triglycerides: 64 mg/dL (ref 0–149)
VLDL Cholesterol Cal: 12 mg/dL (ref 5–40)

## 2023-10-08 LAB — VITAMIN D 25 HYDROXY (VIT D DEFICIENCY, FRACTURES): Vit D, 25-Hydroxy: 25.4 ng/mL — ABNORMAL LOW (ref 30.0–100.0)

## 2023-10-08 LAB — HEMOGLOBIN A1C
Est. average glucose Bld gHb Est-mCnc: 126 mg/dL
Hgb A1c MFr Bld: 6 % — ABNORMAL HIGH (ref 4.8–5.6)

## 2023-10-08 LAB — VITAMIN B12: Vitamin B-12: 417 pg/mL (ref 232–1245)

## 2023-10-13 ENCOUNTER — Telehealth: Payer: Self-pay | Admitting: Family

## 2023-10-13 NOTE — Telephone Encounter (Signed)
Patient left VM that he needs a doctor note to excuse him from missing work last week for his appointment. He also states that the med that was sent in is not working, he thinks he may need a higher dose. I assume he is referring to the sildenafil since that's the med that was sent in on 2/6. Please advise.

## 2023-10-14 ENCOUNTER — Encounter: Payer: Self-pay | Admitting: Family

## 2023-10-14 ENCOUNTER — Other Ambulatory Visit: Payer: Self-pay

## 2023-10-14 MED ORDER — SILDENAFIL CITRATE 100 MG PO TABS: 100.0000 mg | ORAL_TABLET | ORAL | 1 refills | Status: DC | PRN

## 2023-11-11 ENCOUNTER — Other Ambulatory Visit: Payer: Self-pay

## 2023-11-11 MED ORDER — ROSUVASTATIN CALCIUM 40 MG PO TABS: 40.0000 mg | ORAL_TABLET | Freq: Every day | ORAL | 1 refills | Status: DC

## 2024-04-05 ENCOUNTER — Ambulatory Visit: Payer: BC Managed Care – PPO | Admitting: Family

## 2024-04-08 ENCOUNTER — Other Ambulatory Visit: Payer: Self-pay | Admitting: Family

## 2024-05-05 ENCOUNTER — Ambulatory Visit: Admitting: Family

## 2024-05-06 ENCOUNTER — Other Ambulatory Visit: Payer: Self-pay | Admitting: Family

## 2024-05-31 ENCOUNTER — Encounter: Payer: Self-pay | Admitting: Family

## 2024-05-31 ENCOUNTER — Ambulatory Visit: Payer: Self-pay | Admitting: Family

## 2024-05-31 VITALS — BP 128/88 | HR 71 | Ht 72.0 in | Wt 198.0 lb

## 2024-05-31 DIAGNOSIS — E559 Vitamin D deficiency, unspecified: Secondary | ICD-10-CM | POA: Insufficient documentation

## 2024-05-31 DIAGNOSIS — Z1211 Encounter for screening for malignant neoplasm of colon: Secondary | ICD-10-CM | POA: Insufficient documentation

## 2024-05-31 DIAGNOSIS — E538 Deficiency of other specified B group vitamins: Secondary | ICD-10-CM | POA: Insufficient documentation

## 2024-05-31 DIAGNOSIS — E782 Mixed hyperlipidemia: Secondary | ICD-10-CM

## 2024-05-31 DIAGNOSIS — R7303 Prediabetes: Secondary | ICD-10-CM | POA: Diagnosis not present

## 2024-05-31 DIAGNOSIS — I10 Essential (primary) hypertension: Secondary | ICD-10-CM

## 2024-05-31 NOTE — Assessment & Plan Note (Addendum)
-   Check labs today. - Recommend supplementation based off lab results.

## 2024-05-31 NOTE — Assessment & Plan Note (Addendum)
-   Discussed healthy diet and exercise as tolerated. - Continue medications as prescribed. - Check labs today.

## 2024-05-31 NOTE — Assessment & Plan Note (Addendum)
GI referral sent for colonoscopy

## 2024-05-31 NOTE — Progress Notes (Signed)
 Established Patient Office Visit  Subjective:  Patient ID: Nathan Evans, male    DOB: 06-Apr-1961  Age: 63 y.o. MRN: [id number]  Chief Complaint  Patient presents with   Follow-up    6 month follow up    Patient is here today for his 6 months follow up.  He has been feeling well since last appointment.   He does not have additional concerns to discuss today. Patient is past due for his colonoscopy. Will send referral to GI. Labs are due today.  He does not need refills.   I have reviewed his active problem list, medication list, allergies, family history, social history, health maintenance, notes from last encounter, lab results for his appointment today.      No other concerns at this time.   Past Medical History:  Diagnosis Date   Hyperlipidemia    Hypertension     History reviewed. No pertinent surgical history.  Social History   Socioeconomic History   Marital status: Married    Spouse name: Not on file   Number of children: Not on file   Years of education: Not on file   Highest education level: Not on file  Occupational History   Not on file  Tobacco Use   Smoking status: Never   Smokeless tobacco: Never  Substance and Sexual Activity   Alcohol use: Not on file   Drug use: Not on file   Sexual activity: Not on file  Other Topics Concern   Not on file  Social History Narrative   Not on file   Social Drivers of Health   Financial Resource Strain: Not on file  Food Insecurity: Not on file  Transportation Needs: Not on file  Physical Activity: Not on file  Stress: Not on file  Social Connections: Not on file  Intimate Partner Violence: Not on file    History reviewed. No pertinent family history.  No Known Allergies  Review of Systems  Constitutional:  Negative for malaise/fatigue.  HENT: Negative.    Eyes:  Negative for blurred vision and pain.  Respiratory:  Negative for cough and shortness of breath.   Cardiovascular:  Negative  for chest pain, palpitations, claudication and leg swelling.  Gastrointestinal:  Negative for abdominal pain, blood in stool, constipation, diarrhea, nausea and vomiting.  Genitourinary:  Negative for dysuria, frequency and urgency.  Musculoskeletal: Negative.   Skin: Negative.   Neurological:  Negative for dizziness, tingling, sensory change and headaches.  Endo/Heme/Allergies: Negative.   Psychiatric/Behavioral: Negative.         Objective:   BP 128/88   Pulse 71   Ht 6' (1.829 m)   Wt 198 lb (89.8 kg)   SpO2 99%   BMI 26.85 kg/m   Vitals:   05/31/24 1441  BP: 128/88  Pulse: 71  Height: 6' (1.829 m)  Weight: 198 lb (89.8 kg)  SpO2: 99%  BMI (Calculated): 26.85    Physical Exam Vitals and nursing note reviewed.  Constitutional:      Appearance: Normal appearance.  HENT:     Head: Normocephalic.  Eyes:     Extraocular Movements: Extraocular movements intact.     Pupils: Pupils are equal, round, and reactive to light.  Cardiovascular:     Rate and Rhythm: Normal rate and regular rhythm.     Pulses: Normal pulses.     Heart sounds: Normal heart sounds. No murmur heard. Pulmonary:     Effort: Pulmonary effort is normal. No respiratory distress.  Breath sounds: Normal breath sounds.  Abdominal:     General: There is no distension.     Tenderness: There is no abdominal tenderness.  Musculoskeletal:        General: No tenderness. Normal range of motion.     Cervical back: Normal range of motion and neck supple.     Right lower leg: No edema.     Left lower leg: No edema.  Skin:    General: Skin is warm and dry.     Coloration: Skin is not jaundiced.     Findings: No erythema.  Neurological:     General: No focal deficit present.     Mental Status: He is alert and oriented to person, place, and time.  Psychiatric:        Mood and Affect: Mood normal.        Speech: Speech normal.        Behavior: Behavior is cooperative.        Cognition and Memory:  Memory is not impaired.      No results found for any visits on 05/31/24.  No results found for this or any previous visit (from the past 2160 hours).     Assessment & Plan Primary hypertension Mixed hyperlipidemia Prediabetes - Discussed healthy diet and exercise as tolerated. - Continue medications as prescribed. - Check labs today.  B12 deficiency due to diet Vitamin D  deficiency, unspecified - Check labs today. - Recommend supplementation based off lab results.  Encounter for screening colonoscopy - GI referral sent for colonoscopy.   Return in about 6 months (around 11/29/2024).   Total time spent: 25 minutes  Oddis DELENA Cain, FNP  05/31/2024   This document may have been prepared by [facility] Voice Recognition software and as such may include unintentional dictation errors.

## 2024-06-01 ENCOUNTER — Ambulatory Visit: Payer: Self-pay

## 2024-06-01 LAB — CBC WITH DIFFERENTIAL/PLATELET
Basophils Absolute: 0 x10E3/uL (ref 0.0–0.2)
Basos: 1 %
EOS (ABSOLUTE): 0.2 x10E3/uL (ref 0.0–0.4)
Eos: 5 %
Hematocrit: 47.5 % (ref 37.5–51.0)
Hemoglobin: 15.4 g/dL (ref 13.0–17.7)
Immature Grans (Abs): 0 x10E3/uL (ref 0.0–0.1)
Immature Granulocytes: 0 %
Lymphocytes Absolute: 1.9 x10E3/uL (ref 0.7–3.1)
Lymphs: 42 %
MCH: 28.5 pg (ref 26.6–33.0)
MCHC: 32.4 g/dL (ref 31.5–35.7)
MCV: 88 fL (ref 79–97)
Monocytes Absolute: 0.4 x10E3/uL (ref 0.1–0.9)
Monocytes: 8 %
Neutrophils Absolute: 2.1 x10E3/uL (ref 1.4–7.0)
Neutrophils: 44 %
Platelets: 197 x10E3/uL (ref 150–450)
RBC: 5.4 x10E6/uL (ref 4.14–5.80)
RDW: 12.7 % (ref 11.6–15.4)
WBC: 4.6 x10E3/uL (ref 3.4–10.8)

## 2024-06-01 LAB — CMP14+EGFR
ALT: 24 IU/L (ref 0–44)
AST: 22 IU/L (ref 0–40)
Albumin: 4.3 g/dL (ref 3.9–4.9)
Alkaline Phosphatase: 122 IU/L (ref 47–123)
BUN/Creatinine Ratio: 11 (ref 10–24)
BUN: 15 mg/dL (ref 8–27)
Bilirubin Total: 0.7 mg/dL (ref 0.0–1.2)
CO2: 25 mmol/L (ref 20–29)
Calcium: 9.4 mg/dL (ref 8.6–10.2)
Chloride: 101 mmol/L (ref 96–106)
Creatinine, Ser: 1.32 mg/dL — ABNORMAL HIGH (ref 0.76–1.27)
Globulin, Total: 3.2 g/dL (ref 1.5–4.5)
Glucose: 70 mg/dL (ref 70–99)
Potassium: 4.1 mmol/L (ref 3.5–5.2)
Sodium: 141 mmol/L (ref 134–144)
Total Protein: 7.5 g/dL (ref 6.0–8.5)
eGFR: 61 mL/min/1.73 (ref >59)

## 2024-06-01 LAB — HEMOGLOBIN A1C
Est. average glucose Bld gHb Est-mCnc: 123 mg/dL
Hgb A1c MFr Bld: 5.9 % — ABNORMAL HIGH (ref 4.8–5.6)

## 2024-06-01 LAB — LIPID PANEL
Chol/HDL Ratio: 3.2 ratio (ref 0.0–5.0)
Cholesterol, Total: 140 mg/dL (ref 100–199)
HDL: 44 mg/dL (ref >39)
LDL Chol Calc (NIH): 77 mg/dL (ref 0–99)
Triglycerides: 100 mg/dL (ref 0–149)
VLDL Cholesterol Cal: 19 mg/dL (ref 5–40)

## 2024-06-01 LAB — VITAMIN B12: Vitamin B-12: 663 pg/mL (ref 232–1245)

## 2024-06-01 LAB — VITAMIN D 25 HYDROXY (VIT D DEFICIENCY, FRACTURES): Vit D, 25-Hydroxy: 43.2 ng/mL (ref 30.0–100.0)

## 2024-06-01 NOTE — Progress Notes (Signed)
 Patient notified

## 2024-06-22 ENCOUNTER — Other Ambulatory Visit: Payer: Self-pay

## 2024-06-22 ENCOUNTER — Telehealth: Payer: Self-pay

## 2024-06-22 DIAGNOSIS — Z1211 Encounter for screening for malignant neoplasm of colon: Secondary | ICD-10-CM

## 2024-06-22 MED ORDER — NA SULFATE-K SULFATE-MG SULF 17.5-3.13-1.6 GM/177ML PO SOLN: 1.0000 | Freq: Once | ORAL | 0 refills | Status: AC

## 2024-06-22 NOTE — Telephone Encounter (Signed)
 Gastroenterology Pre-Procedure Review  Request Date: 09/15/24 Requesting Physician: Dr. Melany  PATIENT REVIEW QUESTIONS: The patient responded to the following health history questions as indicated:    1. Are you having any GI issues? no 2. Do you have a personal history of Polyps? no 3. Do you have a family history of Colon Cancer or Polyps? no 4. Diabetes Mellitus? no 5. Joint replacements in the past 12 months?no 6. Major health problems in the past 3 months?no 7. Any artificial heart valves, MVP, or defibrillator?no    MEDICATIONS & ALLERGIES:    Patient reports the following regarding taking any anticoagulation/antiplatelet therapy:   Plavix , Coumadin, Eliquis, Xarelto, Lovenox , Pradaxa, Brilinta, or Effient? yes (Pt takes Plavix .  Blood thinner advice has been sent to patients PCP) Aspirin ? no  Patient confirms/reports the following medications:  Current Outpatient Medications  Medication Sig Dispense Refill   amLODipine (NORVASC) 10 MG tablet TAKE 1 TABLET BY MOUTH EVERY DAY 90 tablet 3   clopidogrel  (PLAVIX ) 75 MG tablet TAKE 1 TABLET BY MOUTH EVERY DAY 90 tablet 3   fexofenadine (ALLEGRA) 180 MG tablet Take 180 mg by mouth daily.     rosuvastatin  (CRESTOR ) 40 MG tablet TAKE 1 TABLET BY MOUTH EVERY DAY 90 tablet 1   sildenafil  (VIAGRA ) 100 MG tablet TAKE 1 TABLET BY MOUTH EVERY DAY AS NEEDED FOR ERECTILE DYSFUNCTION 6 tablet 6   No current facility-administered medications for this visit.    Patient confirms/reports the following allergies:  No Known Allergies  No orders of the defined types were placed in this encounter.   AUTHORIZATION INFORMATION Primary Insurance: 1D#: Group #:  Secondary Insurance: 1D#: Group #:  SCHEDULE INFORMATION: Date: 09/15/24 Time: Location: [facility]

## 2024-07-17 ENCOUNTER — Telehealth: Payer: Self-pay | Admitting: Family

## 2024-07-17 NOTE — Telephone Encounter (Signed)
 Left VM stating he is having leg swelling and would like to see Alan.  Can you call and schedule him an appt please?

## 2024-07-21 ENCOUNTER — Encounter: Payer: Self-pay | Admitting: Cardiology

## 2024-07-21 ENCOUNTER — Ambulatory Visit
Admission: RE | Admit: 2024-07-21 | Discharge: 2024-07-21 | Disposition: A | Discharge status: Home / Self Care | Payer: PRIVATE HEALTH INSURANCE | Source: Ambulatory Visit | Attending: Cardiology | Admitting: Cardiology

## 2024-07-21 ENCOUNTER — Ambulatory Visit
Admission: RE | Admit: 2024-07-21 | Discharge: 2024-07-21 | Disposition: A | Discharge status: Home / Self Care | Payer: PRIVATE HEALTH INSURANCE | Attending: Cardiology | Admitting: Cardiology

## 2024-07-21 ENCOUNTER — Ambulatory Visit: Admitting: Cardiology

## 2024-07-21 VITALS — BP 132/70 | HR 70 | Ht 72.0 in | Wt 207.6 lb

## 2024-07-21 DIAGNOSIS — E559 Vitamin D deficiency, unspecified: Secondary | ICD-10-CM

## 2024-07-21 DIAGNOSIS — M25561 Pain in right knee: Secondary | ICD-10-CM | POA: Insufficient documentation

## 2024-07-21 DIAGNOSIS — I10 Essential (primary) hypertension: Secondary | ICD-10-CM

## 2024-07-21 DIAGNOSIS — R7303 Prediabetes: Secondary | ICD-10-CM

## 2024-07-21 DIAGNOSIS — E782 Mixed hyperlipidemia: Secondary | ICD-10-CM

## 2024-07-21 MED ORDER — MELOXICAM 10 MG PO CAPS: 1.0000 | ORAL_CAPSULE | Freq: Every day | ORAL | 1 refills | Status: DC

## 2024-07-21 NOTE — Progress Notes (Signed)
 Established Patient Office Visit  Subjective:  Patient ID: Nathan Evans, male    DOB: July 13, 1961  Age: 63 y.o. MRN: [id number]  Chief Complaint  Patient presents with   Follow-up    Right leg/knee swelling    Patient in office for an acute visit, complaining of right leg, knee swelling. Started about a month ago. Does not remember injuring it. Worse at night when trying to sleep. Has been taking ibuprofen 800 mg with some relief. Will order a knee xray. Will send in meloxicam .  Recent lab work stable and discussed with patient.  Blood pressure controlled.  Continue current medications.  Knee Pain  The incident occurred more than 1 week ago. The incident occurred at work. The injury mechanism is unknown. The pain is present in the right knee. The quality of the pain is described as aching. The pain is at a severity of 8/10. The pain is moderate. The pain has been Fluctuating since onset. Pertinent negatives include no numbness or tingling. He reports no foreign bodies present. He has tried NSAIDs and rest for the symptoms. The treatment provided mild relief.    No other concerns at this time.   Past Medical History:  Diagnosis Date   Hyperlipidemia    Hypertension     History reviewed. No pertinent surgical history.  Social History   Socioeconomic History   Marital status: Married    Spouse name: Not on file   Number of children: Not on file   Years of education: Not on file   Highest education level: Not on file  Occupational History   Not on file  Tobacco Use   Smoking status: Never   Smokeless tobacco: Never  Substance and Sexual Activity   Alcohol use: Not on file   Drug use: Not on file   Sexual activity: Not on file  Other Topics Concern   Not on file  Social History Narrative   Not on file   Social Drivers of Health   Financial Resource Strain: Not on file  Food Insecurity: Not on file  Transportation Needs: Not on file  Physical Activity: Not  on file  Stress: Not on file  Social Connections: Not on file  Intimate Partner Violence: Not on file    History reviewed. No pertinent family history.  No Known Allergies  Outpatient Medications Prior to Visit  Medication Sig   amLODipine (NORVASC) 10 MG tablet TAKE 1 TABLET BY MOUTH EVERY DAY   clopidogrel  (PLAVIX ) 75 MG tablet TAKE 1 TABLET BY MOUTH EVERY DAY   fexofenadine (ALLEGRA) 180 MG tablet Take 180 mg by mouth daily.   rosuvastatin  (CRESTOR ) 40 MG tablet TAKE 1 TABLET BY MOUTH EVERY DAY   sildenafil  (VIAGRA ) 100 MG tablet TAKE 1 TABLET BY MOUTH EVERY DAY AS NEEDED FOR ERECTILE DYSFUNCTION   No facility-administered medications prior to visit.    Review of Systems  Constitutional: Negative.   HENT: Negative.    Eyes: Negative.   Respiratory: Negative.  Negative for shortness of breath.   Cardiovascular: Negative.  Negative for chest pain.  Gastrointestinal: Negative.  Negative for abdominal pain, constipation and diarrhea.  Genitourinary: Negative.   Musculoskeletal:  Positive for joint pain. Negative for myalgias.  Skin: Negative.   Neurological: Negative.  Negative for dizziness, tingling, numbness and headaches.  Endo/Heme/Allergies: Negative.   All other systems reviewed and are negative.      Objective:   BP 132/70   Pulse 70   Ht 6' (1.829  m)   Wt 207 lb 9.6 oz (94.2 kg)   SpO2 99%   BMI 28.16 kg/m   Vitals:   07/21/24 1420  BP: 132/70  Pulse: 70  Height: 6' (1.829 m)  Weight: 207 lb 9.6 oz (94.2 kg)  SpO2: 99%  BMI (Calculated): 28.15    Physical Exam Nursing note reviewed.  Constitutional:      Appearance: Normal appearance. He is normal weight.  HENT:     Head: Normocephalic and atraumatic.     Nose: Nose normal.     Mouth/Throat:     Mouth: Mucous membranes are moist.     Pharynx: Oropharynx is clear.  Eyes:     Extraocular Movements: Extraocular movements intact.     Conjunctiva/sclera: Conjunctivae normal.     Pupils: Pupils  are equal, round, and reactive to light.  Cardiovascular:     Rate and Rhythm: Normal rate and regular rhythm.     Pulses: Normal pulses.     Heart sounds: Normal heart sounds.  Pulmonary:     Effort: Pulmonary effort is normal.     Breath sounds: Normal breath sounds.  Abdominal:     General: Abdomen is flat. Bowel sounds are normal.     Palpations: Abdomen is soft.  Musculoskeletal:        General: Normal range of motion.     Cervical back: Normal range of motion.  Skin:    General: Skin is warm and dry.  Neurological:     General: No focal deficit present.     Mental Status: He is alert and oriented to person, place, and time.  Psychiatric:        Mood and Affect: Mood normal.        Behavior: Behavior normal.        Thought Content: Thought content normal.        Judgment: Judgment normal.      No results found for any visits on 07/21/24.  Recent Results (from the past 2160 hours)  CMP14+EGFR     Status: Abnormal   Collection Time: 05/31/24  3:03 PM  Result Value Ref Range   Glucose 70 70 - 99 mg/dL   BUN 15 8 - 27 mg/dL   Creatinine, Ser 8.67 (H) 0.76 - 1.27 mg/dL   eGFR 61 >40 fO/fpw/8.26   BUN/Creatinine Ratio 11 10 - 24   Sodium 141 134 - 144 mmol/L   Potassium 4.1 3.5 - 5.2 mmol/L   Chloride 101 96 - 106 mmol/L   CO2 25 20 - 29 mmol/L   Calcium  9.4 8.6 - 10.2 mg/dL   Total Protein 7.5 6.0 - 8.5 g/dL   Albumin 4.3 3.9 - 4.9 g/dL   Globulin, Total 3.2 1.5 - 4.5 g/dL   Bilirubin Total 0.7 0.0 - 1.2 mg/dL   Alkaline Phosphatase 122 47 - 123 IU/L   AST 22 0 - 40 IU/L   ALT 24 0 - 44 IU/L  Lipid panel     Status: None   Collection Time: 05/31/24  3:03 PM  Result Value Ref Range   Cholesterol, Total 140 100 - 199 mg/dL   Triglycerides 899 0 - 149 mg/dL   HDL 44 >60 mg/dL   VLDL Cholesterol Cal 19 5 - 40 mg/dL   LDL Chol Calc (NIH) 77 0 - 99 mg/dL   Chol/HDL Ratio 3.2 0.0 - 5.0 ratio    Comment:  T. Chol/HDL Ratio                                              Men  Women                               1/2 Avg.Risk  3.4    3.3                                   Avg.Risk  5.0    4.4                                2X Avg.Risk  9.6    7.1                                3X Avg.Risk 23.4   11.0   VITAMIN D  25 Hydroxy (Vit-D Deficiency, Fractures)     Status: None   Collection Time: 05/31/24  3:03 PM  Result Value Ref Range   Vit D, 25-Hydroxy 43.2 30.0 - 100.0 ng/mL    Comment: Vitamin D  deficiency has been defined by the Institute of Medicine and an Endocrine Society practice guideline as a level of serum 25-OH vitamin D  less than 20 ng/mL (1,2). The Endocrine Society went on to further define vitamin D  insufficiency as a level between 21 and 29 ng/mL (2). 1. IOM (Institute of Medicine). 2010. Dietary reference    intakes for calcium  and D. [place]: The    [employer]. 2. Holick MF, Binkley McDade, Bischoff-Ferrari HA, et al.    Evaluation, treatment, and prevention of vitamin D     deficiency: an Endocrine Society clinical practice    guideline. JCEM. 2011 Jul; 96(7):1911-30.   Vitamin B12     Status: None   Collection Time: 05/31/24  3:03 PM  Result Value Ref Range   Vitamin B-12 663 232 - 1,245 pg/mL  CBC with Diff     Status: None   Collection Time: 05/31/24  3:03 PM  Result Value Ref Range   WBC 4.6 3.4 - 10.8 x10E3/uL   RBC 5.40 4.14 - 5.80 x10E6/uL   Hemoglobin 15.4 13.0 - 17.7 g/dL   Hematocrit 52.4 62.4 - 51.0 %   MCV 88 79 - 97 fL   MCH 28.5 26.6 - 33.0 pg   MCHC 32.4 31.5 - 35.7 g/dL   RDW 87.2 88.3 - 84.5 %   Platelets 197 150 - 450 x10E3/uL   Neutrophils 44 Not Estab. %   Lymphs 42 Not Estab. %   Monocytes 8 Not Estab. %   Eos 5 Not Estab. %   Basos 1 Not Estab. %   Neutrophils Absolute 2.1 1.4 - 7.0 x10E3/uL   Lymphocytes Absolute 1.9 0.7 - 3.1 x10E3/uL   Monocytes Absolute 0.4 0.1 - 0.9 x10E3/uL   EOS (ABSOLUTE) 0.2 0.0 - 0.4 x10E3/uL   Basophils Absolute 0.0 0.0 - 0.2  x10E3/uL   Immature Granulocytes 0 Not Estab. %   Immature Grans (Abs) 0.0 0.0 - 0.1 x10E3/uL  Hemoglobin A1c     Status: Abnormal   Collection Time: 05/31/24  3:03 PM  Result Value Ref Range   Hgb A1c MFr Bld 5.9 (H) 4.8 - 5.6 %    Comment:          Prediabetes: 5.7 - 6.4          Diabetes: >6.4          Glycemic control for adults with diabetes: <7.0    Est. average glucose Bld gHb Est-mCnc 123 mg/dL      Assessment & Plan:  Right knee xray Meloxicam  Continue current medications  Problem List Items Addressed This Visit       Cardiovascular and Mediastinum   Hypertension     Other   Hyperlipidemia   Prediabetes   Vitamin D  deficiency, unspecified   Acute pain of right knee - Primary   Relevant Orders   DG Knee Complete 4 Views Right    Return if symptoms worsen or fail to improve.   Total time spent: 25 minutes. This time includes review of previous notes and results and patient face to face interaction during today's visit.    Jeoffrey Pollen, NP  07/21/2024   This document may have been prepared by Dragon Voice Recognition software and as such may include unintentional dictation errors.

## 2024-07-25 ENCOUNTER — Telehealth: Payer: Self-pay

## 2024-07-25 NOTE — Telephone Encounter (Signed)
 Blood thinner advice has been received from FNP Alan Arrant on 07/20/24.    Per noted advice pt has been advised,  Pt may stop Plavix  3 days prior to procedure.  Patient is to restart blood thinner 3 days after procedure.  Pt verbalized understanding and a copy has been mailed to him as a reminder.  Thanks,  [place], CMA

## 2024-07-28 ENCOUNTER — Other Ambulatory Visit: Payer: Self-pay | Admitting: Cardiology

## 2024-07-28 ENCOUNTER — Telehealth: Payer: Self-pay

## 2024-07-28 ENCOUNTER — Ambulatory Visit: Payer: Self-pay | Admitting: Cardiology

## 2024-07-28 DIAGNOSIS — M25561 Pain in right knee: Secondary | ICD-10-CM

## 2024-07-28 NOTE — Progress Notes (Signed)
Pt informed

## 2024-07-28 NOTE — Telephone Encounter (Signed)
 Pt states his pharmacy said his provider needs to approve his meloxicam  before he can pick it up.

## 2024-08-01 ENCOUNTER — Other Ambulatory Visit: Payer: Self-pay

## 2024-08-03 MED ORDER — MELOXICAM 10 MG PO CAPS: 1.0000 | ORAL_CAPSULE | Freq: Every day | ORAL | 1 refills | Status: AC

## 2024-08-04 ENCOUNTER — Telehealth: Payer: Self-pay

## 2024-08-04 NOTE — Telephone Encounter (Signed)
Pt. Called and notified.

## 2024-08-04 NOTE — Telephone Encounter (Signed)
 Please call pt and let him know the Prior Auth for the Meloxicam  was denied, if he uses good rx he can pay cash for it and its $28

## 2024-09-04 NOTE — Telephone Encounter (Signed)
 Contacted patient to remind him to stop his Plavix  3 days prior to his colonoscopy, and to call me if he has any questions as he starts to prepare.  Thanks,  Rosaline CMA

## 2024-09-12 ENCOUNTER — Encounter: Payer: Self-pay | Admitting: Gastroenterology

## 2024-09-15 ENCOUNTER — Encounter: Payer: Self-pay | Admitting: Gastroenterology

## 2024-09-15 ENCOUNTER — Other Ambulatory Visit: Payer: Self-pay

## 2024-09-15 ENCOUNTER — Ambulatory Visit: Payer: Self-pay | Admitting: Anesthesiology

## 2024-09-15 ENCOUNTER — Encounter: Payer: Self-pay | Admitting: Anesthesiology

## 2024-09-15 ENCOUNTER — Encounter: Admission: RE | Disposition: A | Payer: Self-pay | Source: Home / Self Care | Attending: Gastroenterology

## 2024-09-15 ENCOUNTER — Ambulatory Visit
Admission: RE | Admit: 2024-09-15 | Discharge: 2024-09-15 | Disposition: A | Discharge status: Home / Self Care | Payer: PRIVATE HEALTH INSURANCE | Attending: Gastroenterology | Admitting: Gastroenterology

## 2024-09-15 DIAGNOSIS — K640 First degree hemorrhoids: Secondary | ICD-10-CM | POA: Diagnosis not present

## 2024-09-15 DIAGNOSIS — Z1211 Encounter for screening for malignant neoplasm of colon: Secondary | ICD-10-CM | POA: Insufficient documentation

## 2024-09-15 DIAGNOSIS — I10 Essential (primary) hypertension: Secondary | ICD-10-CM | POA: Insufficient documentation

## 2024-09-15 DIAGNOSIS — K635 Polyp of colon: Secondary | ICD-10-CM

## 2024-09-15 DIAGNOSIS — K5140 Inflammatory polyps of colon without complications: Secondary | ICD-10-CM | POA: Diagnosis not present

## 2024-09-15 HISTORY — DX: Prediabetes: R73.03

## 2024-09-15 HISTORY — DX: Cerebral infarction, unspecified: I63.9

## 2024-09-15 HISTORY — PX: POLYPECTOMY: SHX149

## 2024-09-15 HISTORY — PX: COLONOSCOPY: SHX5424

## 2024-09-15 HISTORY — DX: Unspecified osteoarthritis, unspecified site: M19.90

## 2024-09-15 MED ORDER — LACTATED RINGERS IV SOLN: INTRAVENOUS | Status: DC

## 2024-09-15 MED ORDER — STERILE WATER FOR IRRIGATION IR SOLN
Status: DC | PRN
  Administered 2024-09-15: 250 mL

## 2024-09-15 MED ORDER — SODIUM CHLORIDE 0.9 % IV SOLN: INTRAVENOUS | Status: DC

## 2024-09-15 MED ORDER — PROPOFOL 10 MG/ML IV BOLUS
INTRAVENOUS | Status: DC | PRN
  Administered 2024-09-15: 120 ug/kg/min via INTRAVENOUS
  Administered 2024-09-15: 100 mg via INTRAVENOUS

## 2024-09-15 NOTE — Anesthesia Preprocedure Evaluation (Signed)
"                                    Anesthesia Evaluation  Patient identified by MRN, date of birth, ID band Patient awake    Reviewed: Allergy & Precautions, H&P , NPO status , Patient's Chart, lab work & pertinent test results  Airway Mallampati: II  TM Distance: >3 FB Neck ROM: Full    Dental no notable dental hx.    Pulmonary neg pulmonary ROS   Pulmonary exam normal breath sounds clear to auscultation       Cardiovascular hypertension, negative cardio ROS Normal cardiovascular exam Rhythm:Regular Rate:Normal     Neuro/Psych negative neurological ROS  negative psych ROS   GI/Hepatic negative GI ROS, Neg liver ROS,,,  Endo/Other  negative endocrine ROS    Renal/GU negative Renal ROS  negative genitourinary   Musculoskeletal negative musculoskeletal ROS (+)    Abdominal   Peds negative pediatric ROS (+)  Hematology negative hematology ROS (+)   Anesthesia Other Findings   Reproductive/Obstetrics negative OB ROS                              Anesthesia Physical Anesthesia Plan  ASA: 3  Anesthesia Plan: General   Post-op Pain Management:    Induction: Intravenous  PONV Risk Score and Plan:   Airway Management Planned:   Additional Equipment:   Intra-op Plan:   Post-operative Plan: Extubation in OR  Informed Consent: I have reviewed the patients History and Physical, chart, labs and discussed the procedure including the risks, benefits and alternatives for the proposed anesthesia with the patient or authorized representative who has indicated his/her understanding and acceptance.     Dental advisory given  Plan Discussed with: CRNA  Anesthesia Plan Comments:         Anesthesia Quick Evaluation  "

## 2024-09-15 NOTE — H&P (Signed)
 "  Clotilda Schaffer, MD  [address], [address] [place], [place] [address] Phone: [phone] Fax : [phone]  Primary Care Physician:  Orlean Alan HERO, FNP Primary Gastroenterologist:  Dr. Schaffer  Pre-Procedure History & Physical: HPI:  Nathan Evans is a 64 y.o. male is here for a screening colonoscopy.  Prior colonoscopy? 2016 [facility] CRC?  No Blood thinners? Plavix , last dose 30 days ago because he hasn't refilled his prescription (h/o CVA and blood clots, remote)  Past Medical History:  Diagnosis Date   Arthritis    right knee   Hyperlipidemia    Hypertension    Pre-diabetes    Stroke [facility])    2020    Past Surgical History:  Procedure Laterality Date   COLONOSCOPY N/A    2016    Prior to Admission medications  Medication Sig Start Date End Date Taking? Authorizing Provider  amLODipine (NORVASC) 10 MG tablet TAKE 1 TABLET BY MOUTH EVERY DAY 03/25/23   Orlean Alan HERO, FNP  clopidogrel  (PLAVIX ) 75 MG tablet TAKE 1 TABLET BY MOUTH EVERY DAY 03/25/23   Orlean Alan HERO, FNP  fexofenadine (ALLEGRA) 180 MG tablet Take 180 mg by mouth daily. Patient taking differently: Take 180 mg by mouth as needed.    [provider]  Meloxicam  10 MG CAPS Take 1 capsule by mouth daily. Patient not taking: Reported on 09/12/2024 08/03/24   Orlean Alan HERO, FNP  rosuvastatin  (CRESTOR ) 40 MG tablet TAKE 1 TABLET BY MOUTH EVERY DAY Patient not taking: Reported on 09/12/2024 05/10/24   Orlean Alan HERO, FNP  sildenafil  (VIAGRA ) 100 MG tablet TAKE 1 TABLET BY MOUTH EVERY DAY AS NEEDED FOR ERECTILE DYSFUNCTION 04/10/24   Orlean Alan HERO, FNP    Allergies as of 06/22/2024   (No Known Allergies)    History reviewed. No pertinent family history.  Social History   Socioeconomic History   Marital status: Married    Spouse name: Not on file   Number of children: Not on file   Years of education: Not on file   Highest education level: Not on file  Occupational History    Not on file  Tobacco Use   Smoking status: Never   Smokeless tobacco: Never  Vaping Use   Vaping status: Never Used  Substance and Sexual Activity   Alcohol use: Yes    Comment: occaisionally   Drug use: Yes    Types: Marijuana    Comment: teenage years   Sexual activity: Not on file  Other Topics Concern   Not on file  Social History Narrative   Not on file   Social Drivers of Health   Tobacco Use: Low Risk (09/12/2024)   Patient History    Smoking Tobacco Use: Never    Smokeless Tobacco Use: Never    Passive Exposure: Not on file  Recent Concern: Tobacco Use - Medium Risk (08/15/2024)   Received from [facility]   Patient History    Smoking Tobacco Use: Former    Smokeless Tobacco Use: Never    Passive Exposure: Not on Actuary Strain: Not on file  Food Insecurity: Not on file  Transportation Needs: Not on file  Physical Activity: Not on file  Stress: Not on file  Social Connections: Not on file  Intimate Partner Violence: Not on file  Depression (PHQ2-9): Low Risk (10/07/2023)   Depression (PHQ2-9)    PHQ-2 Score: 1  Alcohol Screen: Not on file  Housing: Unknown (08/15/2024)  Received from [facility]   Epic    Unable to Pay for Housing in the Last Year: Not on file    Number of Times Moved in the Last Year: Not on file    At any time in the past 12 months, were you homeless or living in a shelter (including now)?: No  Utilities: Not on file  Health Literacy: Not on file    Review of Systems: See HPI, otherwise negative ROS  Physical Exam: Ht 6' (1.829 m)   Wt 89.8 kg   BMI 26.85 kg/m  CONSTITUTIONAL: Well-appearing in no acute distress.  HEENT: Pupils equal, round, Extraocular movements intact. Conjunctivae clear NECK: Neck supple CARDIOVASCULAR: Regular rate, no LE edema  RESPIRATORY: No labored breathing  ABDOMEN: Abdomen soft, nontender, not distended, no guarding, no rigidity SKIN: No apparent  skin rashes or lesions. NEUROLOGIC: Normal speech, no focal findings. Mental status alert and oriented x4. PSYCHIATRIC: Mood and affect normal.   Impression/Plan: Nathan Evans is now here to undergo a screening colonoscopy.  Risks, benefits, and alternatives regarding colonoscopy have been reviewed with the patient.  Questions have been answered.  All parties agreeable.  "

## 2024-09-15 NOTE — Anesthesia Postprocedure Evaluation (Signed)
"   Anesthesia Post Note  Patient: Nathan Evans  Procedure(s) Performed: COLONOSCOPY (Rectum) POLYPECTOMY, INTESTINE (Rectum)  Patient location during evaluation: Endoscopy Anesthesia Type: General Level of consciousness: awake and alert Pain management: pain level controlled Vital Signs Assessment: post-procedure vital signs reviewed and stable Respiratory status: spontaneous breathing, nonlabored ventilation, respiratory function stable and patient connected to nasal cannula oxygen Cardiovascular status: blood pressure returned to baseline and stable Postop Assessment: no apparent nausea or vomiting Anesthetic complications: no   No notable events documented.   Last Vitals:  Vitals:   09/15/24 0858 09/15/24 0900  BP:  121/78  Pulse: 64 68  Resp: 19 14  Temp:    SpO2: 100% 99%    Last Pain:  Vitals:   09/15/24 0850  TempSrc: Temporal  PainSc:                  [name]      "

## 2024-09-15 NOTE — Op Note (Signed)
 [facility]astroenter[facility] Patient Name: Nathan Evans Procedure Date: 09/15/2024 8:21 AM MRN: [id number] Account #: [id number] Date of Birth: 06/12/1961 Admit Type: Outpatient Age: 64 Room: [facility] OR ROOM 01 Gender: Male Note Status: Finalized Instrument Name: Colonoscope [id number] Procedure:             Colonoscopy Indications:           Screening for colorectal malignant neoplasm Providers:             Clotilda Schaffer, MD Medicines:             Propofol  per Anesthesia Complications:         No immediate complications. Procedure:             Pre-Anesthesia Assessment:                        - Prior to the procedure, a History and Physical was                         performed, and patient medications and allergies were                         reviewed. The patient's tolerance of previous                         anesthesia was also reviewed. The risks and benefits                         of the procedure and the sedation options and risks                         were discussed with the patient. All questions were                         answered, and informed consent was obtained. Prior                         Anticoagulants: The patient has taken no anticoagulant                         or antiplatelet agents. ASA Grade Assessment: II - A                         patient with mild systemic disease. After reviewing                         the risks and benefits, the patient was deemed in                         satisfactory condition to undergo the procedure.                        After obtaining informed consent, the colonoscope was                         passed under direct vision. Throughout the procedure,                         the patient's blood pressure,  pulse, and oxygen                         saturations were monitored continuously. The                         Colonoscope was introduced through the anus and                         advanced to the  10 cm into the ileum. The colonoscopy                         was performed without difficulty. The patient                         tolerated the procedure well. The quality of the bowel                         preparation was good. The terminal ileum, ileocecal                         valve, appendiceal orifice, and rectum were                         photographed. Findings:      A 15 mm polyp was found in the splenic flexure. The polyp was       pedunculated. Area was tattooed with an injection of Spot (carbon       black). The polyp was removed with a hot snare. Resection and retrieval       were complete. To prevent bleeding post-intervention, one hemostatic       clip was successfully placed. There was no bleeding during, or at the       end, of the procedure.      Internal hemorrhoids were found. The hemorrhoids were Grade I (internal       hemorrhoids that do not prolapse).      The terminal ileum appeared normal. Impression:            - One 15 mm polyp at the splenic flexure, removed with                         a hot snare. Resected and retrieved. Tattooed. Clip                         was placed.                        - Internal hemorrhoids.                        - The examined portion of the ileum was normal. Recommendation:        - Patient has a contact number available for                         emergencies. The signs and symptoms of potential                         delayed complications were discussed with the patient.  Return to normal activities tomorrow. Written                         discharge instructions were provided to the patient.                        - High fiber diet.                        - Continue present medications.                        - Await pathology results.                        - Repeat colonoscopy in 3 - 5 years for surveillance                         based on pathology results.                        - The  findings and recommendations were discussed with                         the designated responsible adult. Procedure Code(s):     --- Professional ---                        [phone], Colonoscopy, flexible; with removal of                         tumor(s), polyp(s), or other lesion(s) by snare                         technique                        45381, Colonoscopy, flexible; with directed submucosal                         injection(s), any substance Diagnosis Code(s):     --- Professional ---                        Z12.11, Encounter for screening for malignant neoplasm                         of colon                        D12.3, Benign neoplasm of transverse colon (hepatic                         flexure or splenic flexure)                        K64.0, First degree hemorrhoids CPT copyright 2022 American Medical Association. All rights reserved. The codes documented in this report are preliminary and upon coder review may  be revised to meet current compliance requirements. Clotilda Schaffer, MD 09/15/2024 8:51:50 AM Number of Addenda: 0 Note Initiated On: 09/15/2024 8:21 AM Scope Withdrawal Time: 0 hours 16 minutes 58 seconds  Total Procedure Duration: 0 hours  18 minutes 36 seconds  Estimated Blood Loss:  Estimated blood loss: none.      [facility]

## 2024-09-15 NOTE — Transfer of Care (Signed)
 Immediate Anesthesia Transfer of Care Note  Patient: Nathan Evans  Procedure(s) Performed: COLONOSCOPY (Rectum) POLYPECTOMY, INTESTINE (Rectum)  Patient Location: PACU  Anesthesia Type: General  Level of Consciousness: awake, alert  and patient cooperative  Airway and Oxygen Therapy: Patient Spontanous Breathing   Post-op Assessment: Post-op Vital signs reviewed, Patient's Cardiovascular Status Stable, Respiratory Function Stable, Patent Airway and No signs of Nausea or vomiting  Post-op Vital Signs: Reviewed and stable  Complications: No notable events documented.

## 2024-09-19 LAB — SURGICAL PATHOLOGY

## 2024-09-20 ENCOUNTER — Ambulatory Visit: Payer: Self-pay | Admitting: Gastroenterology

## 2024-11-29 ENCOUNTER — Ambulatory Visit: Admitting: Family
